# Patient Record
Sex: Female | Born: 1970
Health system: Southern US, Community
[De-identification: ages and names within clinical notes are randomized; demographics above are authoritative.]

## PROBLEM LIST (undated history)

## (undated) DIAGNOSIS — K52839 Microscopic colitis, unspecified: Secondary | ICD-10-CM

## (undated) DIAGNOSIS — K219 Gastro-esophageal reflux disease without esophagitis: Secondary | ICD-10-CM

## (undated) DIAGNOSIS — R87629 Unspecified abnormal cytological findings in specimens from vagina: Secondary | ICD-10-CM

## (undated) DIAGNOSIS — F419 Anxiety disorder, unspecified: Secondary | ICD-10-CM

## (undated) DIAGNOSIS — D649 Anemia, unspecified: Secondary | ICD-10-CM

## (undated) HISTORY — PX: ESOPHAGOGASTRODUODENOSCOPY: SHX1529

## (undated) HISTORY — DX: Microscopic colitis, unspecified: K52.839

## (undated) HISTORY — DX: Anxiety disorder, unspecified: F41.9

## (undated) HISTORY — DX: Unspecified abnormal cytological findings in specimens from vagina: R87.629

## (undated) HISTORY — DX: Gastro-esophageal reflux disease without esophagitis: K21.9

## (undated) HISTORY — PX: COLONOSCOPY: SHX174

---

## 1999-12-27 ENCOUNTER — Other Ambulatory Visit: Admission: RE | Admit: 1999-12-27 | Discharge: 1999-12-27 | Payer: Self-pay | Admitting: Obstetrics

## 2000-07-05 ENCOUNTER — Inpatient Hospital Stay (HOSPITAL_COMMUNITY): Admission: AD | Admit: 2000-07-05 | Discharge: 2000-07-07 | Payer: Self-pay | Admitting: *Deleted

## 2000-07-06 ENCOUNTER — Encounter: Payer: Self-pay | Admitting: *Deleted

## 2004-10-19 ENCOUNTER — Inpatient Hospital Stay (HOSPITAL_COMMUNITY): Admission: AD | Admit: 2004-10-19 | Discharge: 2004-10-20 | Payer: Self-pay | Admitting: Obstetrics & Gynecology

## 2004-10-19 ENCOUNTER — Ambulatory Visit: Payer: Self-pay | Admitting: Obstetrics & Gynecology

## 2004-10-19 ENCOUNTER — Ambulatory Visit: Payer: Self-pay | Admitting: Family Medicine

## 2004-10-22 ENCOUNTER — Inpatient Hospital Stay (HOSPITAL_COMMUNITY): Admission: AD | Admit: 2004-10-22 | Discharge: 2004-10-22 | Payer: Self-pay | Admitting: Obstetrics & Gynecology

## 2004-10-25 ENCOUNTER — Inpatient Hospital Stay (HOSPITAL_COMMUNITY): Admission: AD | Admit: 2004-10-25 | Discharge: 2004-10-25 | Payer: Self-pay | Admitting: Family Medicine

## 2004-11-01 ENCOUNTER — Inpatient Hospital Stay (HOSPITAL_COMMUNITY): Admission: AD | Admit: 2004-11-01 | Discharge: 2004-11-01 | Payer: Self-pay | Admitting: Obstetrics and Gynecology

## 2013-09-14 ENCOUNTER — Telehealth (HOSPITAL_COMMUNITY): Payer: Self-pay | Admitting: *Deleted

## 2013-09-14 NOTE — Telephone Encounter (Signed)
Patient scheduled with [facility]

## 2013-09-21 ENCOUNTER — Encounter (HOSPITAL_COMMUNITY): Payer: Self-pay

## 2013-09-22 ENCOUNTER — Encounter (HOSPITAL_COMMUNITY): Payer: Self-pay

## 2013-09-22 ENCOUNTER — Encounter (INDEPENDENT_AMBULATORY_CARE_PROVIDER_SITE_OTHER): Payer: Self-pay

## 2013-09-22 ENCOUNTER — Ambulatory Visit (HOSPITAL_COMMUNITY)
Admission: RE | Admit: 2013-09-22 | Discharge: 2013-09-22 | Disposition: A | Discharge status: Home / Self Care | Payer: Self-pay | Source: Ambulatory Visit | Attending: Obstetrics and Gynecology | Admitting: Obstetrics and Gynecology

## 2013-09-22 ENCOUNTER — Other Ambulatory Visit: Payer: Self-pay | Admitting: Obstetrics and Gynecology

## 2013-09-22 VITALS — BP 100/60 | Temp 98.1°F | Ht 66.5 in | Wt 164.2 lb

## 2013-09-22 DIAGNOSIS — Z1231 Encounter for screening mammogram for malignant neoplasm of breast: Secondary | ICD-10-CM

## 2013-09-22 DIAGNOSIS — Z1239 Encounter for other screening for malignant neoplasm of breast: Secondary | ICD-10-CM

## 2013-09-22 DIAGNOSIS — R87619 Unspecified abnormal cytological findings in specimens from cervix uteri: Secondary | ICD-10-CM

## 2013-09-22 HISTORY — DX: Anemia, unspecified: D64.9

## 2013-09-22 NOTE — Progress Notes (Signed)
Patient referred to [facility] by the [facility] Department due to recommending a colposcopy and endometrial biopsy for follow up of abnormal AGUS Pap smear on 09/02/2013.  Pap Smear:    Pap smear not completed today. Last Pap smear was 09/02/2013 at the [facility] and AGUS. Referred patient to the [facility] for a colposcopy and endometrial biopsy for follow up. Martie Lee will call patient with follow up appointment time and date. Per patient she has a history of an abnormal Pap smear in January 2014 that required a repeat Pap smear for follow that was April 2014 at [facility] and normal. Last Pap smear result is scanned in EPIC under media.  Physical exam: Breasts Breasts symmetrical. No skin abnormalities bilateral breasts. No nipple retraction bilateral breasts. No nipple discharge bilateral breasts. No lymphadenopathy. No lumps palpated bilateral breasts. No complaints of pain or tenderness on exam. Patient escorted to mammography for a screening mammogram.        Pelvic/Bimanual No Pap smear completed today since last Pap smear was 09/02/2013. Pap smear not indicated per [facility]CP guidelines.

## 2013-09-22 NOTE — Patient Instructions (Addendum)
Taught April Calhoun how to perform BSE and gave educational materials to take home. Patient did not need a Pap smear today due to last Pap smear was 09/02/2013 and abnormal. Referred patient to the [facility] for a colposcopy and endometrial biopsy for follow up. April Calhoun will call patient with follow up appointment time and date. Smoking Cessation discussed with patient.  April Calhoun verbalized understanding. Patient escorted to mammography for a screening mammogram.  April Calhoun, April Maser, RN 4:38 PM

## 2013-10-28 ENCOUNTER — Other Ambulatory Visit (HOSPITAL_COMMUNITY)
Admission: RE | Admit: 2013-10-28 | Discharge: 2013-10-28 | Disposition: A | Discharge status: Home / Self Care | Payer: Self-pay | Source: Ambulatory Visit | Attending: Obstetrics & Gynecology | Admitting: Obstetrics & Gynecology

## 2013-10-28 ENCOUNTER — Encounter: Payer: Self-pay | Admitting: Obstetrics & Gynecology

## 2013-10-28 ENCOUNTER — Ambulatory Visit (INDEPENDENT_AMBULATORY_CARE_PROVIDER_SITE_OTHER): Payer: Self-pay | Admitting: Obstetrics & Gynecology

## 2013-10-28 VITALS — BP 123/71 | HR 72 | Temp 97.9°F | Resp 20 | Ht 66.0 in | Wt 164.8 lb

## 2013-10-28 DIAGNOSIS — R6889 Other general symptoms and signs: Secondary | ICD-10-CM

## 2013-10-28 DIAGNOSIS — IMO0002 Reserved for concepts with insufficient information to code with codable children: Secondary | ICD-10-CM

## 2013-10-28 DIAGNOSIS — Z01812 Encounter for preprocedural laboratory examination: Secondary | ICD-10-CM

## 2013-10-28 DIAGNOSIS — N870 Mild cervical dysplasia: Secondary | ICD-10-CM | POA: Insufficient documentation

## 2013-10-28 LAB — POCT PREGNANCY, URINE
Preg Test, Ur: NEGATIVE
Preg Test, Ur: NEGATIVE

## 2013-10-28 NOTE — Progress Notes (Signed)
Patient ID: April Calhoun, female   DOB: 1970-09-25, 43 y.o.   MRN: [id number]  Chief Complaint  Patient presents with  . Referral    from [facility] for Colpo    HPI April Calhoun is a 43 y.o. female.  [id number] Patient's last menstrual period was 10/23/2013. AGUS pap 08/2014  HPI  Indications: Pap smear on December 2015 showed: glandular cell abnormality (AGUS). Previous colposcopy: . Prior cervical treatment:  .  Past Medical History  Diagnosis Date  . Anemia   . Vaginal Pap smear, abnormal     History reviewed. No pertinent past surgical history.  Family History  Problem Relation Age of Onset  . Diabetes Maternal Grandmother   . Diabetes Paternal Grandmother   . Hyperlipidemia Mother   . Hyperlipidemia Sister     Social History History  Substance Use Topics  . Smoking status: Current Every Day Smoker -- 0.25 packs/day for 9 years    Types: Cigarettes  . Smokeless tobacco: Never Used  . Alcohol Use: No    No Known Allergies  Current Outpatient Prescriptions  Medication Sig Dispense Refill  . cetirizine (ZYRTEC) 10 MG tablet Take 10 mg by mouth daily.      Marland Kitchen. ibuprofen (ADVIL,MOTRIN) 800 MG tablet Take 800 mg by mouth every 8 (eight) hours as needed.       No current facility-administered medications for this visit.    Review of Systems Review of Systems  Blood pressure 123/71, pulse 72, temperature 97.9 F (36.6 C), temperature source Oral, resp. rate 20, height 5\' 6"  (1.676 m), weight 164 lb 12.8 oz (74.753 kg), last menstrual period 10/23/2013.  Physical Exam Physical Exam  Data Reviewed Pap and note [facility]  Assessment    Procedure Details  The risks and benefits of the procedure and Written informed consent obtained. Anterior cervix ectropion, mild AWE TZ seen  Speculum placed in vagina and excellent visualization of cervix achieved, cervix swabbed x 3 with acetic acid solution. Patient given informed consent, signed copy in the chart, time  out was performed. Appropriate time out taken. . The patient was placed in the lithotomy position and the cervix brought into view with sterile speculum.  Portio of cervix cleansed x 2 with betadine swabs.  A tenaculum was placed in the anterior lip of the cervix.  The uterus was sounded for depth of 8 cm . A pipelle was introduced to into the uterus, suction created,  and an endometrial sample was obtained. All equipment was removed and accounted for.  The patient tolerated the procedure well.    Patient given post procedure instructions. The patient will return in 2 weeks for results. Specimens: ECC, 12 and 5 o'clock  Complications: none.     Plan    Specimens labelled and sent to Pathology. Return to discuss Pathology results in 2 weeks.      ARNOLD,JAMES 10/28/2013, 4:43 PM

## 2013-10-28 NOTE — Progress Notes (Signed)
Pt states she has been having abdominal/pelvic/vaginal pain since October. She had ectopic pregnancy 9 yrs ago and has had frequent pain on LLQ for 1 year.  Colposcopy and endometrial biopsy procedures explained. Pt speaks English and declined interpreter.                                         UPT today = negative

## 2013-10-28 NOTE — Patient Instructions (Signed)
Colposcopa (Colposcopy) La colposcopa es un procedimiento para examinar el cuello del tero y la vagina o la zona externa alrededor de la vagina, para buscar signos de enfermedad o anormalidades. Para realizar este procedimiento se utiliza un microscopio con luz, llamado colposcopio. Durante el procedimiento podrn tomarle muestras de tejido, en caso que el profesional encuentre clulas anormales. La colposcopa se indica si la mujer tiene:  Papanicolau anormal. El Papanicolaou es un examen mdico que se realiza para Chief of Staff las clulas que estn en la superficie del cuello uterino.  Un resultado de Pap que podra indicar la presencia del virus del Engineer, technical sales (VPH). El virus puede producir verrugas genitales y est relacionado con el desarrollo de cncer cervical.  Una lcera en el cuello del tero y el resultado del Pap fue normal.  Se han observado verrugas genitales en el cuello del tero o en la zona externa de la vagina.  Una madre que ha consumido dietilstilbestrol (DES) durante el Media planner.  Relaciones sexuales dolorosas.  Hemorragias vaginales, especialmente despus de Retail banker. INFORME A SU MDICO:  Cualquier alergia que tenga.  Todos los [employer] [place], incluyendo vitaminas, hierbas, gotas oftlmicas, cremas y medicamentos de venta libre.  Problemas previos que usted o los [employer] de su familia hayan tenido con el uso de anestsicos.  Enfermedades de [employer].  Cirugas previas.  Padecimientos mdicos. RIESGOS Y COMPLICACIONES En general, la colposcopa es un procedimiento seguro. Sin embargo, Games developer procedimiento, pueden surgir complicaciones. Las complicaciones posibles son:  Hemorragias.  Infeccin.  Lesiones que no se detectan. ANTES DEL PROCEDIMIENTO   Informe al mdico si tiene el perodo menstrual. En general, la colposcopa no se realiza Paediatric nurse.  Durante las 24 horas previas a la colposcopa  no debe:  Patent attorney.  Usar tampones.  Aplicarse medicamentos, cremas o supositorios en la vagina.  Tener [employer]. PROCEDIMIENTO  Durante el procedimiento, estar acostada sobre la espalda con los pies en los soportes (estribos). Le colocarn el la vagina un instrumento metlico o plstico entibiado espculo) para [place] y permitir al profesional visualizar el cuello del tero. El colposcopio se coloca fuera de la vagina. Este instrumento se [place] para ampliar y examinar el cuello del tero, la vagina y la zona externa de la misma. Se aplica una pequea cantidad de solucin lquida en la zona a observar. Este lquido facilita la observacin de clulas anormales. El mdico utilizar instrumentos para aspirar la mucosidad y las clulas del canal del cuello del tero. Luego registrar la ubicacin de las reas anormales. Si le hacen una biopsia durante el procedimiento, le aplicarn un medicamento para adormecer la zona (anestsico local). Podr sentir Clinical cytogeneticist o clicos leves mientras le hacen la biopsia. Despus del procedimiento, las muestras de tejido Publishing copy durante la biopsia se enviarn al laboratorio para ser [employer]. DESPUS DEL PROCEDIMIENTO  Le darn instrucciones para que concurra al control con su mdico para recibir los [employer]. Es importante que cumpla con todas las visitas. Document Released: 09/10/2005 Document Revised: 05/13/2013 [facility] Patient Information 2014 [place], [place].

## 2013-11-02 ENCOUNTER — Encounter: Payer: Self-pay | Admitting: *Deleted

## 2013-11-02 ENCOUNTER — Telehealth: Payer: Self-pay

## 2013-11-02 NOTE — Telephone Encounter (Signed)
Message copied by Louanna RawAMPBELL, Anaisa Radi M on Mon Nov 02, 2013  2:31 PM ------      Message from: Adam PhenixRNOLD, JAMES G      Created: Sat Oct 31, 2013  9:46 AM       Notify patient repeat pap and cotest 12 and 24 months ------

## 2013-11-03 NOTE — Telephone Encounter (Signed)
Called pt. With April Calhoun. No answer. Left message stating we are calling with results, non-urgent, please call clinic.

## 2013-11-04 NOTE — Telephone Encounter (Signed)
Zollie ScaleOlivia called back and left  A message she is calling because she had a message to call

## 2013-11-04 NOTE — Telephone Encounter (Signed)
Called April Calhoun with Interpreter April Calhoun and informed her results back for her colposcopy and was low grade and reccomendation per MD is repeat Pap with hpv testing in one year and again one year after that. Advised to call a few months ahead to make pap appt for Feb 2016.  Also emphasized is important to get pap in one year and not wait longer. April Calhoun voices understanding.

## 2014-07-26 ENCOUNTER — Encounter: Payer: Self-pay | Admitting: Obstetrics & Gynecology

## 2014-11-16 ENCOUNTER — Telehealth (HOSPITAL_COMMUNITY): Payer: Self-pay | Admitting: *Deleted

## 2014-11-16 NOTE — Telephone Encounter (Signed)
Telephoned patient at home # and left message to return call to [facility]. Used interpreter Julie Sowell. 

## 2014-12-17 ENCOUNTER — Telehealth (HOSPITAL_COMMUNITY): Payer: Self-pay | Admitting: *Deleted

## 2014-12-17 NOTE — Telephone Encounter (Signed)
Attempted to call patient with interpreter Nira ConnJulia Sowell to schedule [facility] and Pap smear per recommendation. No one answered phone. Left voicemail for patient to call me back.

## 2015-06-16 ENCOUNTER — Telehealth (HOSPITAL_COMMUNITY): Payer: Self-pay | Admitting: *Deleted

## 2015-06-16 NOTE — Telephone Encounter (Signed)
Telephoned patient at home # and left message to return call to [facility]. Used interpreter Julie Sowell. 

## 2015-07-12 ENCOUNTER — Other Ambulatory Visit: Payer: Self-pay | Admitting: Obstetrics and Gynecology

## 2015-07-12 DIAGNOSIS — Z1231 Encounter for screening mammogram for malignant neoplasm of breast: Secondary | ICD-10-CM

## 2015-07-22 ENCOUNTER — Ambulatory Visit (HOSPITAL_COMMUNITY)
Admission: RE | Admit: 2015-07-22 | Discharge: 2015-07-22 | Disposition: A | Discharge status: Home / Self Care | Payer: Self-pay | Source: Ambulatory Visit | Attending: Obstetrics and Gynecology | Admitting: Obstetrics and Gynecology

## 2015-07-22 ENCOUNTER — Encounter (HOSPITAL_COMMUNITY): Payer: Self-pay

## 2015-07-22 ENCOUNTER — Ambulatory Visit
Admission: RE | Admit: 2015-07-22 | Discharge: 2015-07-22 | Disposition: A | Discharge status: Home / Self Care | Payer: No Typology Code available for payment source | Source: Ambulatory Visit | Attending: Obstetrics and Gynecology | Admitting: Obstetrics and Gynecology

## 2015-07-22 VITALS — BP 108/64 | Temp 98.0°F | Ht 66.0 in | Wt 154.0 lb

## 2015-07-22 DIAGNOSIS — Z1231 Encounter for screening mammogram for malignant neoplasm of breast: Secondary | ICD-10-CM

## 2015-07-22 DIAGNOSIS — Z01419 Encounter for gynecological examination (general) (routine) without abnormal findings: Secondary | ICD-10-CM

## 2015-07-22 NOTE — Patient Instructions (Addendum)
Educational materials on breast self awareness. Explained to April Calhoun that follow-up to today's Pap smear will be based on the result. [facility]. Appointment scheduled for Friday, July 22, 2015 at 1120. Patient aware of appointment and will be there. Let patient know will follow up with her within the next couple weeks with results with results of Pap smear by phone. Smoking Cessation discussed with patient. Referred patient to the [facility] and gave resources to free smoking cessation classes offered at the [facility]. April Calhoun verbalized understanding.  Marylene Masek, Kathaleen Maserhristine Poll, RN 12:26 PM

## 2015-07-22 NOTE — Progress Notes (Signed)
No complaints today.  Pap Smear:  Pap smear completed today. [facility]. [facility] and had a colposcopy and endometrial biopsy on 10/28/2013. 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 Per patient she has a history of an abnormal Pap smear in January 2014 that required a repeat Pap smear for follow that [facility] and normal. Last Pap smear result is scanned in EPIC under media.  Physical exam: Breasts Breasts symmetrical. No skin abnormalities bilateral breasts. No nipple retraction bilateral breasts. No nipple discharge bilateral breasts. No lymphadenopathy. No lumps palpated bilateral breasts. No complaints of pain or tenderness on exam. [facility] mamm[facility]. Appointment scheduled for Friday, July 22, 2015 at 1120.           Pelvic/Bimanual   Ext Genitalia No lesions, no swelling and no discharge observed on external genitalia.         Vagina Vagina pink and normal texture. No lesions or discharge observed in vagina.          Cervix Cervix is present. Cervix pink with a few whitish colored lesions observed on the right side of the cervix next to the os. No discharge observed.     Uterus Uterus is present and palpable. Uterus in normal position and normal size.        Adnexae Bilateral ovaries present and palpable. No tenderness on palpation.          Rectovaginal No rectal exam completed today since patient had no rectal complaints. No skin abnormalities observed on exam.      Smoking Cessation discussed with patient. Referred patient to the [facility] and gave resources to free smoking cessation classes offered at the [facility].  Used interpreter Nira ConnJulia Sowell.

## 2015-07-26 ENCOUNTER — Other Ambulatory Visit: Payer: Self-pay | Admitting: Obstetrics and Gynecology

## 2015-07-26 DIAGNOSIS — R928 Other abnormal and inconclusive findings on diagnostic imaging of breast: Secondary | ICD-10-CM

## 2015-07-28 LAB — CYTOLOGY - PAP

## 2015-08-01 ENCOUNTER — Ambulatory Visit
Admission: RE | Admit: 2015-08-01 | Discharge: 2015-08-01 | Disposition: A | Discharge status: Home / Self Care | Payer: No Typology Code available for payment source | Source: Ambulatory Visit | Attending: Obstetrics and Gynecology | Admitting: Obstetrics and Gynecology

## 2015-08-01 DIAGNOSIS — R928 Other abnormal and inconclusive findings on diagnostic imaging of breast: Secondary | ICD-10-CM

## 2015-08-02 ENCOUNTER — Telehealth (HOSPITAL_COMMUNITY): Payer: Self-pay | Admitting: *Deleted

## 2015-08-02 NOTE — Telephone Encounter (Signed)
Telephoned patient at home # and left message to return call to [facility]. Used interpreter Julie Sowell. 

## 2015-08-02 NOTE — Telephone Encounter (Signed)
Telephoned patient at home # and discussed negative pap smear results. HPV negative. Next pap due in one year. Patient voiced unerstanding.

## 2018-10-25 ENCOUNTER — Emergency Department (HOSPITAL_COMMUNITY): Payer: No Typology Code available for payment source

## 2018-10-25 ENCOUNTER — Emergency Department (HOSPITAL_COMMUNITY)
Admission: EM | Admit: 2018-10-25 | Discharge: 2018-10-25 | Disposition: A | Discharge status: Home / Self Care | Payer: No Typology Code available for payment source | Attending: Emergency Medicine | Admitting: Emergency Medicine

## 2018-10-25 ENCOUNTER — Ambulatory Visit (HOSPITAL_COMMUNITY)
Admission: EM | Admit: 2018-10-25 | Discharge: 2018-10-25 | Disposition: A | Discharge status: Home / Self Care | Payer: No Typology Code available for payment source | Attending: Family Medicine | Admitting: Family Medicine

## 2018-10-25 ENCOUNTER — Encounter (HOSPITAL_COMMUNITY): Payer: Self-pay

## 2018-10-25 ENCOUNTER — Other Ambulatory Visit: Payer: Self-pay

## 2018-10-25 DIAGNOSIS — R079 Chest pain, unspecified: Secondary | ICD-10-CM | POA: Insufficient documentation

## 2018-10-25 DIAGNOSIS — F17210 Nicotine dependence, cigarettes, uncomplicated: Secondary | ICD-10-CM | POA: Insufficient documentation

## 2018-10-25 DIAGNOSIS — R200 Anesthesia of skin: Secondary | ICD-10-CM | POA: Insufficient documentation

## 2018-10-25 LAB — BASIC METABOLIC PANEL
Anion gap: 10 (ref 5–15)
BUN: 14 mg/dL (ref 6–20)
CHLORIDE: 103 mmol/L (ref 98–111)
CO2: 24 mmol/L (ref 22–32)
Calcium: 9.1 mg/dL (ref 8.9–10.3)
Creatinine, Ser: 0.55 mg/dL (ref 0.44–1.00)
GFR calc non Af Amer: >60 mL/min (ref >60)
GLUCOSE: 92 mg/dL (ref 70–99)
POTASSIUM: 4 mmol/L (ref 3.5–5.1)
Sodium: 137 mmol/L (ref 135–145)

## 2018-10-25 LAB — CBC
HEMATOCRIT: 39.2 % (ref 36.0–46.0)
HEMOGLOBIN: 12.6 g/dL (ref 12.0–15.0)
MCH: 28.1 pg (ref 26.0–34.0)
MCHC: 32.1 g/dL (ref 30.0–36.0)
MCV: 87.3 fL (ref 80.0–100.0)
Platelets: 201 10*3/uL (ref 150–400)
RBC: 4.49 MIL/uL (ref 3.87–5.11)
RDW: 14.6 % (ref 11.5–15.5)
WBC: 5.7 10*3/uL (ref 4.0–10.5)
nRBC: 0 % (ref 0.0–0.2)

## 2018-10-25 LAB — I-STAT TROPONIN, ED: Troponin i, poc: 0 ng/mL (ref 0.00–0.08)

## 2018-10-25 MED ORDER — ASPIRIN 81 MG PO CHEW
324.0000 mg | CHEWABLE_TABLET | Freq: Once | ORAL | Status: AC
Start: 2018-10-25 — End: 2018-10-25
  Administered 2018-10-25: 324 mg via ORAL
  Filled 2018-10-25: qty 4

## 2018-10-25 MED ORDER — CYCLOBENZAPRINE HCL 10 MG PO TABS: 5.0000 mg | ORAL_TABLET | Freq: Once | ORAL | Status: DC

## 2018-10-25 MED ORDER — CYCLOBENZAPRINE HCL 10 MG PO TABS: 10.0000 mg | ORAL_TABLET | Freq: Two times a day (BID) | ORAL | 0 refills | Status: DC | PRN

## 2018-10-25 NOTE — ED Triage Notes (Signed)
Pt arrives POV for eval of chest discomfort, neck pain and L arm numbness. Pt reports she awoke w/ L arm numbness this morning at 0830 and reports L sided neck pain onset at that same time. Pt w/ no weakness or changes in sensation to L arm. No focal neuro deficits on exam. GCS 15.

## 2018-10-25 NOTE — ED Provider Notes (Signed)
[facility] EMERGENCY DEPARTMENT Provider Note   CSN: [id number] Arrival date & time: 10/25/18  1400     History   Chief Complaint Chief Complaint  Patient presents with  . Arm Numbness  . Chest Pain    HPI MARJAN FATICA is a 48 y.o. female.  HPI 48 year old female presents today complaining of 2 weeks of waxing and waning chest pain.  She is unable to identify anything that causes the pain to ".  Describes it as pressure in the left chest with some numbness in her arm.  She denies it occurring secondary to exertion, coughing, or associated symptoms such as dyspnea.  She does complain of some muscular pain in the upper back.  She feels that this is occurred secondary to stress.  Today she has had the pain since awakening greater than 5 hours.  It is currently 5 out of 10.  She has taken Naprosyn for pain and needs a pain patch on her back.  She has no history of coronary artery disease.  She is a smoker.  She denies hypertension, hypercholesterolemia, or family history of heart disease. Past Medical History:  Diagnosis Date  . Anemia   . Vaginal Pap smear, abnormal     Patient Active Problem List   Diagnosis Date Noted  . 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 No pertinent surgical history.   OB History    Gravida  4   Para  2   Term  2   Preterm      AB  2   Living  2     SAB  1   TAB      Ectopic  1   Multiple      Live Births               Home Medications    Prior to Admission medications   Medication Sig Start Date End Date Taking? Authorizing Provider  cetirizine (ZYRTEC) 10 MG tablet Take 10 mg by mouth daily.    [provider]  ibuprofen (ADVIL,MOTRIN) 800 MG tablet Take 800 mg by mouth every 8 (eight) hours as needed.    [provider]    Family History Family History  Problem Relation Age of Onset  . Diabetes  Maternal Grandmother   . Diabetes Paternal Grandmother   . Hyperlipidemia Mother   . Hyperlipidemia Sister     Social History Social History   Tobacco Use  . Smoking status: Current Every Day Smoker    Packs/day: 0.25    Years: 9.00    Pack years: 2.25    Types: Cigarettes  . Smokeless tobacco: Never Used  Substance Use Topics  . Alcohol use: No  . Drug use: No     Allergies   Patient has no known allergies.   Review of Systems Review of Systems  All other systems reviewed and are negative.    Physical Exam Updated Vital Signs BP (!) 146/86   Pulse 70   Temp 98.7 F (37.1 C) (Oral)   Resp 18   Ht 1.676 m (5\' 6" )   Wt 70.3 kg   SpO2 100%   BMI 25.02 kg/m   Physical Exam Vitals signs and nursing note reviewed.  Constitutional:      Appearance: She is well-developed.  HENT:     Head: Normocephalic and atraumatic.     Right Ear: External ear normal.  Left Ear: External ear normal.     Nose: Nose normal.  Eyes:     Conjunctiva/sclera: Conjunctivae normal.     Pupils: Pupils are equal, round, and reactive to light.  Neck:     Musculoskeletal: Normal range of motion and neck supple.     Thyroid: No thyromegaly.     Vascular: No JVD.     Trachea: No tracheal deviation.  Cardiovascular:     Rate and Rhythm: Normal rate and regular rhythm.     Heart sounds: Normal heart sounds.  Pulmonary:     Effort: Pulmonary effort is normal.     Breath sounds: Normal breath sounds. No wheezing.  Chest:     Chest wall: No mass, deformity or crepitus.  Abdominal:     General: Bowel sounds are normal.     Palpations: Abdomen is soft. There is no mass.     Tenderness: There is no abdominal tenderness. There is no guarding.  Musculoskeletal: Normal range of motion.  Lymphadenopathy:     Cervical: No cervical adenopathy.  Skin:    General: Skin is warm and dry.  Neurological:     Mental Status: She is alert and oriented to person, place, and time.     GCS: GCS  eye subscore is 4. GCS verbal subscore is 5. GCS motor subscore is 6.     Cranial Nerves: No cranial nerve deficit.     Sensory: No sensory deficit.     Gait: Gait normal.     Deep Tendon Reflexes: Reflexes are normal and symmetric. Babinski sign absent on the right side. Babinski sign absent on the left side.     Reflex Scores:      Bicep reflexes are 2+ on the right side and 2+ on the left side.      Patellar reflexes are 2+ on the right side and 2+ on the left side.    Comments: Strength is normal and equal throughout. Cranial nerves grossly intact. Patient fluent. No gross ataxia and patient able to ambulate without difficulty.  Psychiatric:        Behavior: Behavior normal.        Thought Content: Thought content normal.        Judgment: Judgment normal.      ED Treatments / Results  Labs (all labs ordered are listed, but only abnormal results are displayed) Labs Reviewed  CBC  BASIC METABOLIC PANEL  I-STAT TROPONIN, ED    EKG EKG Interpretation  Date/Time:  Saturday October 25 2018 14:07:15 EST Ventricular Rate:  66 PR Interval:  134 QRS Duration: 80 QT Interval:  410 QTC Calculation: 429 R Axis:   48 Text Interpretation:  Normal sinus rhythm Normal ECG Confirmed by Margarita Grizzleay, Tahlor Berenguer [phone]([id number]) on 10/25/2018 3:32:04 PM   Radiology Dg Chest Port 1 View  Result Date: 10/25/2018 CLINICAL DATA:  Pt arrives POV for eval of chest discomfort, neck pain and L arm numbness. Pt reports she awoke w/ L arm numbness this morning at 0830 and reports L sided neck pain onset at that same time EXAM: PORTABLE CHEST 1 VIEW COMPARISON:  None. FINDINGS: The heart size and mediastinal contours are within normal limits. Both lungs are clear. No pleural effusion or pneumothorax. The visualized skeletal structures are unremarkable. IMPRESSION: No active disease. Electronically Signed   By: Amie Portlandavid  Ormond M.D.   On: 10/25/2018 15:16    Procedures Procedures (including critical care  time)  Medications Ordered in ED Medications  aspirin chewable tablet 324  mg (324 mg Oral Given 10/25/18 1452)     Initial Impression / Assessment and Plan / ED Course  I have reviewed the triage vital signs and the nursing notes.  Pertinent labs & imaging results that were available during my care of the patient were reviewed by me and considered in my medical decision making (see chart for details).    48 year old female presents today with chest pain.  Low index of suspicion for aortic dissection, acute intrapulmonary problem, or PE.  Heart score is 3.  She has had pain present for greater than 6 hours at this point.  Her troponin is normal and do not think she requires a repeat troponin.  Discussed return precautions and need for follow-up with patient she voices understanding.  Final Clinical Impressions(s) / ED Diagnoses   Final diagnoses:  Nonspecific chest pain    ED Discharge Orders    None       Margarita Grizzleay, Kwanza Cancelliere, MD 10/25/18 1539

## 2018-10-25 NOTE — ED Notes (Signed)
Pt here for CP, L arm numbess x7 days, per dr. Hyacinth Meeker pt needs eval in ER, ekg obtained and given to Dr. Hyacinth Meeker. Pt left for ER.

## 2018-10-25 NOTE — ED Notes (Signed)
Declined W/C at D/C and was escorted to lobby by RN. 

## 2019-11-19 ENCOUNTER — Ambulatory Visit (HOSPITAL_BASED_OUTPATIENT_CLINIC_OR_DEPARTMENT_OTHER): Payer: Self-pay | Admitting: Pharmacist

## 2019-11-19 ENCOUNTER — Encounter: Payer: Self-pay | Admitting: Internal Medicine

## 2019-11-19 ENCOUNTER — Other Ambulatory Visit: Payer: Self-pay

## 2019-11-19 ENCOUNTER — Ambulatory Visit: Payer: Self-pay | Attending: Internal Medicine | Admitting: Internal Medicine

## 2019-11-19 VITALS — BP 112/68 | HR 65 | Temp 97.2°F | Resp 16 | Ht 67.0 in | Wt 159.6 lb

## 2019-11-19 DIAGNOSIS — Z23 Encounter for immunization: Secondary | ICD-10-CM

## 2019-11-19 DIAGNOSIS — M542 Cervicalgia: Secondary | ICD-10-CM

## 2019-11-19 DIAGNOSIS — D509 Iron deficiency anemia, unspecified: Secondary | ICD-10-CM

## 2019-11-19 DIAGNOSIS — Z7689 Persons encountering health services in other specified circumstances: Secondary | ICD-10-CM

## 2019-11-19 DIAGNOSIS — R87619 Unspecified abnormal cytological findings in specimens from cervix uteri: Secondary | ICD-10-CM

## 2019-11-19 NOTE — Progress Notes (Signed)
New Patient Office Visit  Subjective:  Patient ID: April Calhoun, female    DOB: 02-11-1971  Age: 49 y.o. MRN: [id number]  CC:  Establish Care  Chief Complaint  Patient presents with  . New Patient (Initial Visit)    HPI April Calhoun is a 49 year old female with history of atypical glandular cells of undetermined significance on cervical Pap smear who presents to establish care.   PRESENT ILLNESS: Taking cyclonezaprine since February 2020 as needed for bilateral shoulder and neck discomfort but doesn't take often because she does not like how the medication makes her feel dizzy. Denies allergies. Denies any present illnesses.  PAST HISTORY: Denies childhood and adulthood illnesses. More than 5 years ago since last visit with a primary care provider. Denies surgical and psychiatric history. Reports has not had influenza vaccine since 20 years ago, would not like one today. Reports up to date on all other immunizations. Has two children. No menstruation since December 2020. Last PAP around 2015, last mammogram around 2016.   Past Medical History:  Diagnosis Date  . Anemia   . Vaginal Pap smear, abnormal    No past surgical history on file.  FAMILY HISTORY: Denies family history of hypertension, coronary artery disease, stroke, thyroid or renal disease, cancer and specify type, arthritis, tuberculosis, asthma, lung disease, headache, seizure disorder, mental illness, suicide, alcohol or drug addiction, and allergies.   Family History  Problem Relation Age of Onset  . Diabetes Maternal Grandmother   . Diabetes Paternal Grandmother   . Hyperlipidemia Mother   . Hyperlipidemia Sister    PERSONAL AND SOCIAL HISTORY: Occupation is Advertising copywriter. Lives with boyfriend and son. Denies exercise. Denies any leisure activities. Small amount of red wine occasionally. Smokes at least 3 cigarettes daily for the past 5 years. Has began and quit smoking multiple times over  the past 9 years. Denies illicit drug use. Social History   Socioeconomic History  . Marital status: Significant Other    Spouse name: Not on file  . Number of children: Not on file  . Years of education: Not on file  . Highest education level: Not on file  Occupational History  . Not on file  Tobacco Use  . Smoking status: Current Every Day Smoker    Packs/day: 0.25    Years: 9.00    Pack years: 2.25    Types: Cigarettes  . Smokeless tobacco: Never Used  Substance and Sexual Activity  . Alcohol use: No  . Drug use: No  . Sexual activity: Yes    Birth control/protection: Condom  Other Topics Concern  . Not on file  Social History Narrative  . Not on file   Social Determinants of Health   Financial Resource Strain:   . Difficulty of Paying Living Expenses: Not on file  Food Insecurity:   . Worried About Programme researcher, broadcasting/film/video in the Last Year: Not on file  . Ran Out of Food in the Last Year: Not on file  Transportation Needs:   . Lack of Transportation (Medical): Not on file  . Lack of Transportation (Non-Medical): Not on file  Physical Activity:   . Days of Exercise per Week: Not on file  . Minutes of Exercise per Session: Not on file  Stress:   . Feeling of Stress : Not on file  Social Connections:   . Frequency of Communication with Friends and Family: Not on file  . Frequency of Social Gatherings with Friends and Family:  Not on file  . Attends Religious Services: Not on file  . Active Member of Clubs or Organizations: Not on file  . Attends Archivist Meetings: Not on file  . Marital Status: Not on file  Intimate Partner Violence:   . Fear of Current or Ex-Partner: Not on file  . Emotionally Abused: Not on file  . Physically Abused: Not on file  . Sexually Abused: Not on file    ROS Review of Systems  Constitutional: Negative for chills and fever.  HENT: Negative for congestion, sore throat and tinnitus.   Eyes: Negative for blurred vision.    Respiratory: Negative for cough, shortness of breath and wheezing.   Cardiovascular: Negative for chest pain, palpitations and leg swelling.  Gastrointestinal: Negative for heartburn, nausea and vomiting.  Musculoskeletal: Positive for neck pain.       Bilateral shoulder pain  All others negative except stated above.  Objective:   Today's Vitals: BP 112/68   Pulse 65   Temp (!) 97.2 F (36.2 C)   Resp 16   Ht 5\' 7"  (1.702 m)   Wt 159 lb 9.6 oz (72.4 kg)   SpO2 98%   BMI 25.00 kg/m   Physical Exam  General appearance - alert, well appearing, and in no distress and oriented to person, place, and time Mental status - alert, oriented to person, place, and time, normal mood, behavior, speech, dress, motor activity, and thought processes Chest - clear to auscultation, no wheezes, rales or rhonchi, symmetric air entry, no tachypnea, retractions or cyanosis Heart - normal rate, regular rhythm, normal S1, S2, no murmurs, rubs, clicks or gallops Neurological - alert, oriented, normal speech, no focal findings or movement disorder noted  Assessment & Plan:  1. Encounter to establish care: - CBC - Comprehensive metabolic panel - Lipid panel - Hemoglobin A1c - HIV antibody (with reflex) -Return in 1 month for physical exam and Pap Smear -Apply for orange card and blue card for [facility] financial assistance -Referral for mammogram needed. Patient should complete mammogram scholarship for financial assistance.  Problem List Items Addressed This Visit    None      Outpatient Encounter Medications as of 11/19/2019  Medication Sig  . cetirizine (ZYRTEC) 10 MG tablet Take 10 mg by mouth daily.  . cyclobenzaprine (FLEXERIL) 10 MG tablet Take 1 tablet (10 mg total) by mouth 2 (two) times daily as needed for muscle spasms. (Patient not taking: Reported on 11/19/2019)  . ibuprofen (ADVIL,MOTRIN) 800 MG tablet Take 800 mg by mouth every 8 (eight) hours as needed.   No  facility-administered encounter medications on file as of 11/19/2019.    Follow-up: 1 month with attending physician     [name], NP

## 2019-11-19 NOTE — Progress Notes (Signed)
Patient presents for vaccination against influenza and tetanus per orders of Dr. Johnson. Consent given. Counseling provided. No contraindications exists. Vaccine administered without incident.   

## 2019-11-19 NOTE — Progress Notes (Signed)
Pt states she has bene having a lot of joint pain   Pt states her joints hurt worse in the morning when she is getting up

## 2019-11-19 NOTE — Patient Instructions (Addendum)
Return in 1 month for PAP.  Vacuna Td (ttanos y difteria): lo que debe saber Td (Tetanus, Diphtheria) Vaccine: What You Need to Know 1. Por qu vacunarse? La vacuna Td puede prevenir el ttanos y la difteria. El ttanos ingresa al organismo a travs de cortes o heridas. La difteria se contagia de persona a Nurse, learning disability.  El Myton (T) provoca rigidez dolorosa en los msculos. El ttanos puede causar graves problemas de [place], como no poder abrir la boca, Best boy dificultad para tragar y Ambulance person, o la muerte.  La DIFTERIA (D) puede causar dificultad para respirar, insuficiencia cardaca, parlisis o muerte. 2. Edward Jolly Td La vacuna Td es solo para nios de 7 aos en adelante, adolescentes y [place].  La vacuna Td habitualmente se aplica como dosis de refuerzo cada 10aos, pero tambin puede administrarse antes si la persona sufre una [place] o herida sucia y grave. En lugar de la vacuna Td, se puede utilizar otra vacuna llamada Tdap que, adems del ttanos y la difteria, protege contra la tos [place], tambin conocida como "tos convulsa".  La vacuna Td puede aplicarse al mismo tiempo que otras vacunas. 3. Hable con el mdico Comunquese con la persona que le coloca las vacunas si la persona que la recibe:  Ha tenido una reaccin alrgica despus de Ardelia Mems dosis anterior de cualquier vacuna contra el ttanos o la difteria, o cualquier alergia grave, potencialmente mortal.  Alguna vez tuvo sndrome de Guillain-Barr (tambin llamado SGB).  Ha tenido dolor intenso o hinchazn despus de una dosis anterior de cualquier vacuna contra el ttanos o la difteria. En algunos casos, es posible que el mdico decida posponer la aplicacin de la vacuna Td para una visita en el futuro.  Las personas que sufren trastornos menores, como un resfro, pueden vacunarse. Las [employer] tienen enfermedades moderadas o graves generalmente deben esperar hasta recuperarse para poder recibir la vacunaTd.  Su mdico puede  darle ms informacin. 4. [place] de [place] reaccin a la vacuna  Despus de recibir la vacuna Td a veces se puede Patent attorney, enrojecimiento o Estate agent donde se aplic la inyeccin, fiebre leve, dolor de cabeza, sensacin de cansancio y nuseas, vmitos, diarrea o dolor de [place]. Las personas a veces se desmayan despus de procedimientos mdicos, incluida la vacunacin. Informe al mdico si se siente mareado, tiene cambios en la visin o zumbidos en los odos.  Al igual que con cualquier [employer], existe una probabilidad muy remota de que una vacuna cause una reaccin alrgica grave, otra lesin grave o la muerte. 5. Qu pasa si se presenta un problema grave? Podra producirse una reaccin alrgica despus de que la persona vacunada abandone la clnica. Si observa signos de Nurse, mental health grave (ronchas, hinchazn de la cara y la garganta, dificultad para respirar, latidos cardacos acelerados, mareos o debilidad), llame al 9-1-1 y lleve a la persona al hospital ms cercano.  Si se presentan otros signos que le preocupan, comunquese con su mdico.  Las reacciones adversas deben informarse al Sistema de Informe de Eventos Adversos de Clinical biochemist (Vaccine Adverse Event Reporting System, VAERS). Por lo general, el mdico presenta este informe o puede hacerlo usted mismo. Visite el sitio web del VAERS en www.[email or web address].SamedayNews.es o llame al 4401793660. El VAERS es solo para Electrical engineer; su personal no proporciona asesoramiento mdico. 6. [place] de Compensacin de Daos por [place] de Compensacin de Daos por Clinical biochemist (National Vaccine Injury Fiserv, VICP) es un programa federal que fue creado para  compensar a las [employer] puedan haber sufrido daos al recibir ciertas vacunas. Visite el sitio web del VICP en SpiritualWord.at o llame al 1-(435) 135-4088 para obtener ms informacin acerca del programa y de cmo presentar  un reclamo. Hay un lmite de tiempo para presentar un reclamo de compensacin. 7. Cmo puedo obtener ms informacin?  Pregntele a su mdico.  Comunquese con el servicio de salud de su localidad o su estado.  Comunquese con Building control surveyor for [employer] and Prevention, CDC (Centros para el Control y la Prevencin de [place]): ? Llame al [phone] (1-800-CDC-INFO) o ? Visite el sitio Environmental manager en PicCapture.uy Declaracin de informacin sobre la vacuna Td (09/27/2018) Esta informacin no tiene Theme park manager el consejo del mdico. Asegrese de hacerle al mdico cualquier pregunta que tenga. Document Revised: 01/20/2019 Document Reviewed: 01/20/2019 Elsevier Patient Education  2020 Elsevier Inc.   Influenza Virus Vaccine injection (Fluarix) Qu es este medicamento? La VACUNA ANTIGRIPAL ayuda a disminuir el riesgo de contraer la influenza, tambin conocida como la gripe. La vacuna solo ayuda a protegerle contra algunas cepas de influenza. Esta vacuna no ayuda a reducir Nurse, adult de contraer influenza pandmica H1N1. Este medicamento puede ser utilizado para otros usos; si tiene alguna pregunta consulte con su proveedor de atencin mdica o con su farmacutico. MARCAS COMUNES: Fluarix, Fluzone Qu le debo informar a mi profesional de la salud antes de tomar este medicamento? Necesita saber si usted presenta alguno de los siguientes problemas o situaciones:  trastorno de sangrado como hemofilia  fiebre o infeccin  sndrome de Guillain-Barre u otros problemas neurolgicos  problemas del sistema inmunolgico  infeccin por el virus de la inmunodeficiencia humana (VIH) o SIDA  niveles bajos de plaquetas en la sangre  esclerosis mltiple  una reaccin Counselling psychologist o inusual a las vacunas antigripales, a los huevos, protenas de pollo, al ltex, a la gentamicina, a otros medicamentos, alimentos, colorantes o conservantes  si est embarazada o buscando  quedar embarazada  si est amamantando a un beb Cmo debo [employer]? Esta vacuna se administra mediante inyeccin por va intramuscular. Lo administra un profesional de [employer]. Recibir una copia de informacin escrita sobre la vacuna antes de cada vacuna. Asegrese de leer este folleto cada vez cuidadosamente. Este folleto puede cambiar con frecuencia. Hable con su pediatra para informarse acerca del uso de este medicamento en nios. Puede requerir atencin especial. Sobredosis: Pngase en contacto inmediatamente con un centro toxicolgico o una sala de urgencia si usted cree que haya tomado demasiado medicamento. ATENCIN: [employer] es solo para usted. No comparta este medicamento con nadie. Qu sucede si me olvido de una dosis? No se aplica en este caso. Qu puede interactuar con este medicamento?  quimioterapia o radioterapia  medicamentos que suprimen el sistema inmunolgico, tales como etanercept, anakinra, infliximab y adalimumab  medicamentos que tratan o previenen cogulos sanguneos, como warfarina  fenitona  medicamentos esteroideos, como la prednisona o la cortisona  teofilina  vacunas Puede ser que esta lista no menciona todas las posibles interacciones. Informe a su profesional de [employer] de [employer] productos a base de hierbas, medicamentos de [place] o suplementos nutritivos que est tomando. Si usted fuma, consume bebidas alcohlicas o si utiliza drogas ilegales, indqueselo tambin a su profesional de [employer]. Algunas sustancias pueden interactuar con su medicamento. A qu debo estar atento al usar [employer]? Informe a su mdico o a Producer, television/film/video de [employer] todos los efectos secundarios que [place]  despus de 3 das. Llame a su proveedor de atencin mdica si se presentan sntomas inusuales dentro de las 6 semanas posteriores a la vacunacin. Es posible que todava pueda contraer la gripe, pero la enfermedad no ser  tan fuerte como normalmente. No puede contraer la gripe de esta vacuna. La vacuna antigripal no le protege contra resfros u otras enfermedades que pueden causar [place]. Debe vacunarse cada ao. Qu efectos secundarios puedo tener al [employer] este medicamento? Efectos secundarios que debe informar a su mdico o a Producer, television/film/video de la salud tan pronto como sea posible:  Therapist, art como erupcin cutnea, picazn o urticarias, hinchazn de la cara, labios o lengua Efectos secundarios que, por lo general, no requieren atencin mdica (debe informarlos a su mdico o a su profesional de la salud si persisten o si son molestos):  fiebre  dolor de cabeza  molestias y Art gallery manager, sensibilidad, enrojecimiento o Paramedic de la inyeccin  cansancio o debilidad Puede ser que esta lista no menciona todos los posibles efectos secundarios. Comunquese a su mdico por asesoramiento mdico [employer]. Usted puede informar los efectos secundarios a la FDA por telfono al 1-800-FDA-1088. Dnde debo guardar mi medicina? Esta vacuna se administra solamente en clnicas, farmacias, consultorio mdico u otro consultorio de un profesional de la salud y no Teacher, early years/pre en su domicilio. ATENCIN: Este folleto es un resumen. Puede ser que no cubra toda la posible informacin. Si usted tiene preguntas acerca de esta medicina, consulte con su mdico, su farmacutico o su profesional de Radiographer, therapeutic.  2020 Elsevier/Gold Standard (2010-03-14 15:31:40)

## 2019-11-20 LAB — COMPREHENSIVE METABOLIC PANEL
ALT: 23 IU/L (ref 0–32)
AST: 22 IU/L (ref 0–40)
Albumin/Globulin Ratio: 1.7 (ref 1.2–2.2)
Albumin: 4.1 g/dL (ref 3.8–4.8)
Alkaline Phosphatase: 69 IU/L (ref 39–117)
BUN/Creatinine Ratio: 25 — ABNORMAL HIGH (ref 9–23)
BUN: 16 mg/dL (ref 6–24)
Bilirubin Total: 0.5 mg/dL (ref 0.0–1.2)
CO2: 23 mmol/L (ref 20–29)
Calcium: 9.1 mg/dL (ref 8.7–10.2)
Chloride: 106 mmol/L (ref 96–106)
Creatinine, Ser: 0.63 mg/dL (ref 0.57–1.00)
GFR calc Af Amer: 122 mL/min/{1.73_m2} (ref >59)
GFR calc non Af Amer: 106 mL/min/{1.73_m2} (ref >59)
Globulin, Total: 2.4 g/dL (ref 1.5–4.5)
Glucose: 88 mg/dL (ref 65–99)
Potassium: 4.2 mmol/L (ref 3.5–5.2)
Sodium: 143 mmol/L (ref 134–144)
Total Protein: 6.5 g/dL (ref 6.0–8.5)

## 2019-11-20 LAB — LIPID PANEL
Chol/HDL Ratio: 3.1 ratio (ref 0.0–4.4)
Cholesterol, Total: 156 mg/dL (ref 100–199)
HDL: 50 mg/dL (ref >39)
LDL Chol Calc (NIH): 88 mg/dL (ref 0–99)
Triglycerides: 100 mg/dL (ref 0–149)
VLDL Cholesterol Cal: 18 mg/dL (ref 5–40)

## 2019-11-20 LAB — CBC
Hematocrit: 32.5 % — ABNORMAL LOW (ref 34.0–46.6)
Hemoglobin: 9.9 g/dL — ABNORMAL LOW (ref 11.1–15.9)
MCH: 23.9 pg — ABNORMAL LOW (ref 26.6–33.0)
MCHC: 30.5 g/dL — ABNORMAL LOW (ref 31.5–35.7)
MCV: 79 fL (ref 79–97)
Platelets: 202 10*3/uL (ref 150–450)
RBC: 4.14 x10E6/uL (ref 3.77–5.28)
RDW: 16 % — ABNORMAL HIGH (ref 11.7–15.4)
WBC: 4.5 10*3/uL (ref 3.4–10.8)

## 2019-11-20 LAB — HEMOGLOBIN A1C
Est. average glucose Bld gHb Est-mCnc: 114 mg/dL
Hgb A1c MFr Bld: 5.6 % (ref 4.8–5.6)

## 2019-11-20 LAB — HIV ANTIBODY (ROUTINE TESTING W REFLEX): HIV Screen 4th Generation wRfx: NONREACTIVE

## 2019-11-20 NOTE — Addendum Note (Signed)
Addended by: Rema Fendt on: 11/20/2019 07:35 AM   Modules accepted: Orders

## 2019-11-20 NOTE — Progress Notes (Signed)
CBC indicative of anemia. Will add on iron panel for confirmation. BUN/creatinine ratio slightly elevated, CMP otherwise normal. Lipid panel normal.

## 2019-11-23 MED ORDER — FERROUS SULFATE 325 (65 FE) MG PO TABS: 325.0000 mg | ORAL_TABLET | Freq: Every day | ORAL | 0 refills | Status: DC

## 2019-11-23 NOTE — Addendum Note (Signed)
Addended by: Rema Fendt on: 11/23/2019 11:16 AM   Modules accepted: Orders

## 2019-11-23 NOTE — Progress Notes (Signed)
Please call patient with update. Patient has iron deficiency anemia meaning her iron is low. Please take ferrous sulfate 325 mg/day for at least 6 months. Will reassess labs at next visit if needed. Eating iron rich foods will assist with this as well such as liver, dried beans and peas, prunes, raisins, and green leafy vegetables. Taking iron pill with orange juice which is rich in vitamin C may also increase absorption.

## 2019-11-27 LAB — IRON,TIBC AND FERRITIN PANEL
Ferritin: 3 ng/mL — ABNORMAL LOW (ref 15–150)
Iron Saturation: 8 % — CL (ref 15–55)
Iron: 38 ug/dL (ref 27–159)
Total Iron Binding Capacity: 484 ug/dL — ABNORMAL HIGH (ref 250–450)
UIBC: 446 ug/dL — ABNORMAL HIGH (ref 131–425)

## 2019-11-27 LAB — SPECIMEN STATUS REPORT

## 2019-11-30 ENCOUNTER — Telehealth: Payer: Self-pay

## 2019-11-30 NOTE — Telephone Encounter (Signed)
Contacted pt to go over lab results pt didn't answer lvm asking pt to give me a call at her earliest convenience  

## 2019-12-01 ENCOUNTER — Telehealth: Payer: Self-pay

## 2019-12-01 NOTE — Telephone Encounter (Signed)
Pt returned call to go over lab results pt is aware and doesn't have any questions or concerns  

## 2020-01-07 ENCOUNTER — Encounter: Payer: No Typology Code available for payment source | Admitting: Internal Medicine

## 2020-01-26 ENCOUNTER — Other Ambulatory Visit: Payer: Self-pay

## 2020-01-26 ENCOUNTER — Ambulatory Visit: Payer: Self-pay

## 2020-03-10 ENCOUNTER — Ambulatory Visit: Payer: Self-pay | Attending: Internal Medicine | Admitting: Internal Medicine

## 2020-03-10 ENCOUNTER — Other Ambulatory Visit: Payer: Self-pay | Admitting: Family

## 2020-03-10 ENCOUNTER — Other Ambulatory Visit: Payer: Self-pay

## 2020-03-10 ENCOUNTER — Encounter: Payer: Self-pay | Admitting: Internal Medicine

## 2020-03-10 VITALS — BP 112/73 | HR 63 | Temp 97.5°F | Resp 16 | Wt 156.0 lb

## 2020-03-10 DIAGNOSIS — D509 Iron deficiency anemia, unspecified: Secondary | ICD-10-CM

## 2020-03-10 DIAGNOSIS — R197 Diarrhea, unspecified: Secondary | ICD-10-CM

## 2020-03-10 DIAGNOSIS — Z124 Encounter for screening for malignant neoplasm of cervix: Secondary | ICD-10-CM

## 2020-03-10 DIAGNOSIS — Z1211 Encounter for screening for malignant neoplasm of colon: Secondary | ICD-10-CM

## 2020-03-10 DIAGNOSIS — Z1331 Encounter for screening for depression: Secondary | ICD-10-CM

## 2020-03-10 DIAGNOSIS — Z1231 Encounter for screening mammogram for malignant neoplasm of breast: Secondary | ICD-10-CM

## 2020-03-10 NOTE — Progress Notes (Signed)
Patient ID: April Calhoun, female    DOB: 1971/05/11  MRN: [id number]  CC: Gynecologic Exam   Subjective: April Calhoun is a 49 y.o. female who presents for pap/well woman Her concerns today include:  Iron deficiency anemia diagnosed 10/2019,  Pt is G4P2 (2 miscarriage) Last pap was 2018 and was nl per pt. Had abn pap in 2015.  Subseq bx neg per pt LNMP 01/07/2020.  Prior to that it was 08/2019.  Menses last 1 wk. + hot flashes but not too bad. Sexually active with 1 partner.  Uses condoms Chronic intermittent white dischg. No breast lumps.  No previous bx.  Over due for MMG Ovarian cancer in paternal aunt  Found to have iron def anemia 10/2019.  taking iron 1 once a day. she was having diarrhea for 1 mth.  Started after she ate some baby back ribs from an outside source.  The ribs appear to be well cooked.  Diarrhea x 1 mth with BM 3-6 x a day.  Stools are loose.  Noticed blood in stools x 2. Last time was yesterday.  + bloating when she eats green leaf veggies and beans. No fever. No recent international travel.  No major wgh changes.  She has tried Imodium, Pepto-Bismol and probiotics a few times.  None of them seem to have helped.    Patient Active Problem List   Diagnosis Date Noted  . Atypical glandular cells of undetermined significance (AGUS) on cervical Pap smear 09/22/2013     Current Outpatient Medications on File Prior to Visit  Medication Sig Dispense Refill  . cetirizine (ZYRTEC) 10 MG tablet Take 10 mg by mouth daily.    . cyclobenzaprine (FLEXERIL) 10 MG tablet Take 1 tablet (10 mg total) by mouth 2 (two) times daily as needed for muscle spasms. (Patient not taking: Reported on 11/19/2019) 20 tablet 0  . ferrous sulfate 325 (65 FE) MG tablet Take 1 tablet (325 mg total) by mouth daily with breakfast. 100 tablet 0  . ibuprofen (ADVIL,MOTRIN) 800 MG tablet Take 800 mg by mouth every 8 (eight) hours as needed.     No current facility-administered  medications on file prior to visit.    No Known Allergies  Social History   Socioeconomic History  . Marital status: Significant Other    Spouse name: Not on file  . Number of children: Not on file  . Years of education: Not on file  . Highest education level: Not on file  Occupational History  . Not on file  Tobacco Use  . Smoking status: Current Every Day Smoker    Packs/day: 0.25    Years: 9.00    Pack years: 2.25    Types: Cigarettes  . Smokeless tobacco: Never Used  Substance and Sexual Activity  . Alcohol use: No  . Drug use: No  . Sexual activity: Yes    Birth control/protection: Condom  Other Topics Concern  . Not on file  Social History Narrative  . Not on file   Social Determinants of Health   Financial Resource Strain:   . Difficulty of Paying Living Expenses:   Food Insecurity:   . Worried About Programme researcher, broadcasting/film/video in the Last Year:   . Barista in the Last Year:   Transportation Needs:   . Freight forwarder (Medical):   Marland Kitchen Lack of Transportation (Non-Medical):   Physical Activity:   . Days of Exercise per Week:   .  Minutes of Exercise per Session:   Stress:   . Feeling of Stress :   Social Connections:   . Frequency of Communication with Friends and Family:   . Frequency of Social Gatherings with Friends and Family:   . Attends Religious Services:   . Active Member of Clubs or Organizations:   . Attends Archivist Meetings:   Marland Kitchen Marital Status:   Intimate Partner Violence:   . Fear of Current or Ex-Partner:   . Emotionally Abused:   Marland Kitchen Physically Abused:   . Sexually Abused:     Family History  Problem Relation Age of Onset  . Diabetes Maternal Grandmother   . Diabetes Paternal Grandmother   . Hyperlipidemia Mother   . Hyperlipidemia Sister     No past surgical history on file.  ROS: Review of Systems Negative except as stated above  PHYSICAL EXAM: BP 112/73   Pulse 63   Temp (!) 97.5 F (36.4 C)   Resp 16    Wt 156 lb (70.8 kg)   SpO2 98%   BMI 24.43 kg/m   Wt Readings from Last 3 Encounters:  03/10/20 156 lb (70.8 kg)  11/19/19 159 lb 9.6 oz (72.4 kg)  10/25/18 155 lb (70.3 kg)    Physical Exam RN April Calhoun present for pelvic and rectal exam. General appearance - alert, well appearing, and in no distress Breasts - breasts appear normal, no suspicious masses, no skin or nipple changes or axillary nodes Pelvic - normal external genitalia, vulva, vagina, cervix, uterus and adnexa Abdomen: Normal bowel sounds, soft nontender no organomegaly. Rectal exam: No external hemorrhoids.  No solid stool felt within the rectal vault.  Hemoccult positive. Extremities -trace lower extremity edema.  Positive varicose veins in both lower extremities.  Depression screen April Calhoun 2/9 03/10/2020  Decreased Interest 2  Down, Depressed, Hopeless 2  PHQ - 2 Score 4  Altered sleeping 2  Tired, decreased energy 3  Change in appetite 1  Trouble concentrating 1  Moving slowly or fidgety/restless 0  Suicidal thoughts 0  PHQ-9 Score 11    CMP Latest Ref Rng & Units 11/19/2019 10/25/2018  Glucose 65 - 99 mg/dL 88 92  BUN 6 - 24 mg/dL 16 14  Creatinine 0.57 - 1.00 mg/dL 0.63 0.55  Sodium 134 - 144 mmol/L 143 137  Potassium 3.5 - 5.2 mmol/L 4.2 4.0  Chloride 96 - 106 mmol/L 106 103  CO2 20 - 29 mmol/L 23 24  Calcium 8.7 - 10.2 mg/dL 9.1 9.1  Total Protein 6.0 - 8.5 g/dL 6.5 -  Total Bilirubin 0.0 - 1.2 mg/dL 0.5 -  Alkaline Phos 39 - 117 IU/L 69 -  AST 0 - 40 IU/L 22 -  ALT 0 - 32 IU/L 23 -   Lipid Panel     Component Value Date/Time   CHOL 156 11/19/2019 1149   TRIG 100 11/19/2019 1149   HDL 50 11/19/2019 1149   CHOLHDL 3.1 11/19/2019 1149   LDLCALC 88 11/19/2019 1149    CBC    Component Value Date/Time   WBC 4.5 11/19/2019 1149   WBC 5.7 10/25/2018 1451   RBC 4.14 11/19/2019 1149   RBC 4.49 10/25/2018 1451   HGB 9.9 (L) 11/19/2019 1149   HCT 32.5 (L) 11/19/2019 1149   PLT 202  11/19/2019 1149   MCV 79 11/19/2019 1149   MCH 23.9 (L) 11/19/2019 1149   MCH 28.1 10/25/2018 1451   MCHC 30.5 (L) 11/19/2019 1149  MCHC 32.1 10/25/2018 1451   RDW 16.0 (H) 11/19/2019 1149    ASSESSMENT AND PLAN:  1. Pap smear for cervical cancer screening - Cervicovaginal ancillary only - Cytology - PAP  2. Encounter for screening mammogram for malignant neoplasm of breast - MM Digital Screening; Future  3. Screening for colon cancer - Ambulatory referral to Gastroenterology  4. Diarrhea of presumed infectious origin Started after she ate some ribs from a [employer] about a month ago.  She reports noticing blood in the stools x2 and she is heme positive today.  Weight has remained stable.  We will check for C. difficile and routine stool cultures and refer to gastroenterology - Ambulatory referral to Gastroenterology - Cdiff NAA+O+P+Stool Culture  5. Iron deficiency anemia, unspecified iron deficiency anemia type Continue iron supplement.  Recheck CBC today - Ambulatory referral to Gastroenterology - CBC  6. Positive depression screening We did not get to discuss this today.  However I can have our LCSW follow-up with her   Patient was given the opportunity to ask questions.  Patient verbalized understanding of the plan and was able to repeat key elements of the plan.   Orders Placed This Encounter  Procedures  . Cdiff NAA+O+P+Stool Culture  . MM Digital Screening  . CBC  . Ambulatory referral to Gastroenterology     Requested Prescriptions    No prescriptions requested or ordered in this encounter    Return in about 3 months (around 06/10/2020).  April Blue, MD, FACP

## 2020-03-10 NOTE — Patient Instructions (Addendum)
You have been referred to a gastroenterologist.  You have been referred for mammogram.  Please call the BCCCP (breast and cervical cancer control program) at [phone] to schedule diagnostic mammogram    Opciones de alimentos para ayudar a Technical sales engineer [employer] Choices to Help Relieve Diarrhea, Adult Cuando tiene [place], los alimentos que ingiere y los hbitos de alimentacin son Engineer, production. Elegir los [employer] y las bebidas adecuados puede ayudar a lo siguiente:  Actuary.  Remplazar los lquidos y nutrientes perdidos.  Evitar la deshidratacin. Qu pautas generales debo seguir?  Alivio de la diarrea  Elija alimentos con menos de 2g o 0,07oz de fibra por porcin.  Limite las grasas a menos de 8cucharaditas (38g o 1,34oz) por da.  Evite consumir lo siguiente: ? Alimentos y bebidas endulzados con jarabe de maz de alto contenido de fructosa, miel o alcoholes de International aid/development worker, como xilitol, sorbitol y manitol. ? Alimentos que contengan mucha grasa o azcar. ? Comidas fritas, grasosas o picantes. ? Granos, panes y cereales con alto contenido de fibras. ? Nils Pyle y verduras crudas.  Consuma alimentos con alto contenido de probiticos. Entre ellos, los productos lcteos, como el yogur y los productos fermentados de la Tolstoy. Estos ayudan a aumentar la cantidad de bacterias del estmago y de los intestinos (tubo digestivo o gastrointestinal [GI]).  Si tiene intolerancia a la lactosa, evite los productos lcteos. Estos podran empeorar la diarrea.  Solo tome antibiticos para detener la diarrea (medicamentos antidiarreicos) como se lo haya indicado el mdico. Remplazo de nutrientes  Coma pequeas comidas o colaciones cada 3o4horas.  Coma alimentos blandos, como arroz blanco, pan tostado o papas al horno, hasta que comience a recuperarse de la diarrea. De forma gradual, reincorpore alimentos con alto contenido de nutrientes segn lo tolere o  segn se lo haya indicado el mdico. Esto incluye lo siguiente: ? Alimentos proteicos bien cocidos. ? Nils Pyle y verduras peladas, sin semillas y apenas cocidas. ? Productos lcteos descremados.  Tome suplementos de vitaminas y [employer] se lo haya indicado el mdico. Automotive engineer la deshidratacin  Comience bebiendo pequeos sorbos de agua o de alguna solucin especial para evitar la deshidratacin(solucin de rehidratacin oral, SRO). Si bebe una cantidad de lquidos suficiente, la orina ser de tono claro o color amarillo plido.  Intente beber, al menos, entre 8y10tazas de lquidos [address] para ayudar a [employer]Mc[employer]ee lquidos perdidos.  Podra agregar otros lquidos adems del agua, como jugos claros o bebidas deportivas descafeinadas, segn lo tolere o segn las indicaciones del mdico.  Evite consumir bebidas con cafena, como caf, t o gaseosas.  Evite el alcohol. Qu alimentos se recomiendan? Esta podra no ser [employer] de los elementos recomendados. Hable con el mdico sobre las mejores opciones alimenticias para usted. Cereales Arroz blanco. Pan blanco, francs o pita (fresco o tostado), incluidos los [place], los bollos y las rosquillas. Pastas blancas. Galletas de [place], [place] o [place]. Pretzels. Cereales con bajo contenido de Cecilia. Cereales cocidos en agua (como harina de maz, smola o crema de cereales). Muffins. Pan cimo. Tostada Melba. [place]. Verduras Papas (sin cscara). La mayora de las verduras bien cocidas y enlatadas sin semillas ni cscara. Deatra James tierna. Frutas Pur de [place]. Frutas enlatadas en jugo. Damascos, cerezas, pomelos, duraznos, peras o ciruelas cocidos. Bananas y [employer]inois. Carnes y otros alimentos proteicos. Pollo al horno o hervido. Huevos. Tofu. Pescado. Mariscos. Mantequilla suave de frutos secos. Carne molida o un bife tierno bien cocido, [place], [place],  cordero, cerdo o aves. Lcteos Yogur natural, kefir y Estate agent  bebible sin Risk analyst. Leche deslactosada, suero de la [place], [place] soja. Queso duro descremado o semidescremado. [employer]. Bebidas deportivas de bajas caloras. Jugos de frutas sin pulpa. Jugos colados de tomates o de verduras. Ts descafeinados. Bebidas sin azcar no endulzadas con alcoholes de azcar. Soluciones de rehidratacin oral, si el mdico lo autoriza. Condimentos y otros alimentos     Consom, caldo o sopas hechas con los alimentos recomendados. Qu alimentos no se recomiendan? Esta podra no ser [employer] de los elementos recomendados. Hable con el mdico sobre las mejores opciones alimenticias para usted. Cereales Galletas, pastas, panecillos y panes integrales, de trigo integral, salvado o centeno. Arroz integral o arroz salvaje. Cereales integrales o de salvado. Cebada. Avena. Tortillas de maz o tacos. Granola. Palomitas de maz. Verduras Verduras crudas. Verduras fritas. Repollo, brcoli, repollitos de [place]us[place], alcachofas, porotos cocidos, hojas de remolacha, maz, col rizada, legumbres, guisantes y batatas. Cscara de papas. [place] y repollo cocidos. Lambert Mody Frutas secas, incluidas las ciruelas y los dtiles. Frutas crudas. Compota o ciruelas secas. Frutas enlatadas con almbar. Carne y otros alimentos con protenas Carnes fritas o grasosas. Fiambres. Mantequillas de frutos secos espesas. Frutos secos y semillas. Porotos y lentejas. Panceta. Perros calientes. Salchichas. Lcteos Quesos con alto contenido de [place]. Salvadore Dom, leche chocolatada y bebidas hechas con [place], [place] los batidos. Mitad leche y Haematologist. Crema y Tax inspector. Helados. Bebidas Bebidas con cafena (como caf, t, refrescos o bebidas energizantes). Bebidas alcohlicas. Jugos de frutas con pulpa. Jugo de ciruelas. Bebidas endulzadas con jarabe de maz de alto contenido de fructosa o alcoholes de Location manager. Bebidas deportivas con muchas caloras. Grasas y  [employer]. Salsas a base de crema. Margarina. Aceites para ensaladas. Condimentos para ensaladas. Aceitunas. Agu[place]. Mayonesa. Dulces y DIRECTV, donas y pan dulce. Postres sin azcar endulzados con alcoholes de azcar, tales como xilitol y sorbitol. Condimentos y [place]. Salsa picante. [place] en polvo. Salsas. Sopas a base de crema o [place]. Panqueques y waffles. Resumen  Cuando tiene [place], los alimentos que ingiere y los hbitos de alimentacin son Theatre stage manager.  No olvide tomar, al [employer], entre 8y10tazas de lquido [employer] o lo que sea suficiente para Theatre manager la orina clara o de color amarillo plido.  Coma alimentos blandos y, de forma gradual, reincorpore alimentos saludables con alto contenido de nutrientes segn lo tolere o segn se lo haya indicado el mdico.  Evite consumir alimentos fritos, grasosos, picantes o con alto contenido de [place]. Esta informacin no tiene Marine scientist el consejo del mdico. Asegrese de hacerle al mdico cualquier pregunta que tenga. Document Revised: 12/07/2017 Document Reviewed: 12/14/2016 Elsevier Patient Education  [place].

## 2020-03-11 ENCOUNTER — Telehealth: Payer: Self-pay

## 2020-03-11 LAB — CERVICOVAGINAL ANCILLARY ONLY
Bacterial Vaginitis (gardnerella): NEGATIVE
Candida Glabrata: NEGATIVE
Candida Vaginitis: NEGATIVE
Chlamydia: NEGATIVE
Comment: NEGATIVE
Comment: NEGATIVE
Comment: NEGATIVE
Comment: NEGATIVE
Comment: NEGATIVE
Comment: NORMAL
Neisseria Gonorrhea: NEGATIVE
Trichomonas: NEGATIVE

## 2020-03-11 LAB — CYTOLOGY - PAP
Comment: NEGATIVE
Diagnosis: NEGATIVE
High risk HPV: NEGATIVE

## 2020-03-11 LAB — SPECIMEN STATUS REPORT

## 2020-03-11 LAB — CBC
Hematocrit: 40 % (ref 34.0–46.6)
Hemoglobin: 12.7 g/dL (ref 11.1–15.9)
MCH: 27.5 pg (ref 26.6–33.0)
MCHC: 31.8 g/dL (ref 31.5–35.7)
MCV: 87 fL (ref 79–97)
Platelets: 225 10*3/uL (ref 150–450)
RBC: 4.61 x10E6/uL (ref 3.77–5.28)
RDW: 15.8 % — ABNORMAL HIGH (ref 11.7–15.4)
WBC: 4.1 10*3/uL (ref 3.4–10.8)

## 2020-03-11 NOTE — Telephone Encounter (Signed)
Contacted pt to go over lab/pap results pt is aware and doesn't have any questions or concerns

## 2020-03-11 NOTE — Progress Notes (Signed)
Let patient know that she is no longer anemic.  She should cut back on taking iron supplement from once daily to 1 tablet every Monday, Wednesday and Friday instead.

## 2020-03-15 ENCOUNTER — Other Ambulatory Visit: Payer: Self-pay

## 2020-03-15 DIAGNOSIS — Z1231 Encounter for screening mammogram for malignant neoplasm of breast: Secondary | ICD-10-CM

## 2020-03-18 LAB — CDIFF NAA+O+P+STOOL CULTURE: E coli, Shiga toxin Assay: NEGATIVE

## 2020-04-14 ENCOUNTER — Ambulatory Visit
Admission: RE | Admit: 2020-04-14 | Discharge: 2020-04-14 | Disposition: A | Discharge status: Home / Self Care | Payer: No Typology Code available for payment source | Source: Ambulatory Visit | Attending: Internal Medicine | Admitting: Internal Medicine

## 2020-04-14 ENCOUNTER — Other Ambulatory Visit: Payer: Self-pay

## 2020-04-14 ENCOUNTER — Ambulatory Visit: Payer: Self-pay | Admitting: *Deleted

## 2020-04-14 VITALS — BP 112/76 | Temp 98.4°F | Wt 151.1 lb

## 2020-04-14 DIAGNOSIS — Z1239 Encounter for other screening for malignant neoplasm of breast: Secondary | ICD-10-CM

## 2020-04-14 NOTE — Progress Notes (Signed)
Ms. Felcia Huebert is a 49 y.o. female who presents to [facility] clinic today with no complaints.    Pap Smear: Pap smear not completed today. Last Pap smear was 03/10/2020 at [facility] clinic and was normal with negative HPV. Per patient has history of an abnormal Pap smear in December 2014. She had a colposcopy in February 2015 to follow up for this abnormal Pap smear. She has had 2 normal Pap smears since then. Last Pap smear result is available in Epic.   Physical exam: Breasts Breasts symmetrical. No skin abnormalities bilateral breasts. No nipple retraction bilateral breasts. No nipple discharge bilateral breasts. No lymphadenopathy. No lumps palpated bilateral breasts. No complaints of pain or tenderness on exam.       Pelvic/Bimanual Pap is not indicated today.    Smoking History: Patient is a current smoker. She was referred to the [facility] Quit line.    Patient Navigation: Patient education provided. Access to services provided for patient through BCCCP program. Natale Lay, Spanish interpreter provided through [facility]   Colorectal Cancer Screening: Per patient she has not had a colonoscopy. She was referred to GI by her PCP to schedule her appointment. No complaints today.    Breast and Cervical Cancer Risk Assessment: Patient does not have family history of breast cancer, known genetic mutations, or radiation treatment to the chest before age 77. Patient has history of cervical dysplasia. No history of being immunocompromised, or DES exposure in-utero.  Risk Assessment    Risk Scores      04/14/2020   Last edited by: Meryl Dare, CMA   5-year risk: 0.7 %   Lifetime risk: 6.5 %          A: BCCCP exam without pap smear No complaints today.  P: Referred patient to the [facility] for a screening mammogram on the mobile unit. Appointment scheduled April 14, 2020 at 1:40pm.  Mila Homer, RN, FNP student 04/14/2020 1:57 PM   Attestation of  Supervision of Student:  I confirm that I have verified the information documented in the nurse practitioner student's note and that I have also personally reperformed the history, physical exam and all medical decision making activities.  I have verified that all services and findings are accurately documented in this student's note; and I agree with management and plan as outlined in the documentation. I have also made any necessary editorial changes.  Patient has a history of an AGUS Pap smear 09/02/2013 that a colposcopy and endometrial biopsy was completed for follow up 10/28/2013. The colposcopy showed CIN-I and the endometrial biopsy was benign. Patient has had two normal Pap smears with negative HPV since colposcopy on 07/22/2015 and 03/10/2020. Next Pap smear is due in 3 years per ASCCP guidelines.  Brannock, Kathaleen Maser, RN [facility] for [employer], [facility] 04/14/2020 10:38 PM

## 2020-04-14 NOTE — Patient Instructions (Addendum)
Informed Grace Blight about breast self awareness. Patient did not need a Pap smear today due to last Pap smear was on 03/10/2020. Let her know her next Pap smear is due in 3 years based on the recommendation by ASCCP guidelines due to her Pap smear history. Referred patient to the [facility] of [facility] for screening mammogram on the mobile unit. Appointment scheduled for April 14, 2020 at 1:40pm. Patient aware of appointment and will be there. Let patient know the Breast Center will follow up with her within the next couple weeks with results of her mammogram by letter or phone. Celene Kras Juarez verbalized understanding.  Mila Homer, RN, FNP student 2:06 PM

## 2020-04-19 ENCOUNTER — Encounter: Payer: Self-pay | Admitting: Gastroenterology

## 2020-04-28 ENCOUNTER — Telehealth: Payer: Self-pay | Admitting: Internal Medicine

## 2020-04-28 NOTE — Telephone Encounter (Signed)
Patient lost her CAFA letter and would want a new one. Please f/u

## 2020-04-28 NOTE — Telephone Encounter (Signed)
Pt was call to pick a copy of the CAFA letter at the FO

## 2020-06-07 ENCOUNTER — Ambulatory Visit (AMBULATORY_SURGERY_CENTER): Payer: Self-pay | Admitting: *Deleted

## 2020-06-07 ENCOUNTER — Other Ambulatory Visit: Payer: Self-pay

## 2020-06-07 VITALS — Ht 67.0 in | Wt 152.0 lb

## 2020-06-07 DIAGNOSIS — Z1211 Encounter for screening for malignant neoplasm of colon: Secondary | ICD-10-CM

## 2020-06-07 MED ORDER — PLENVU 140 G PO SOLR: 1.0000 | ORAL | 0 refills | Status: DC

## 2020-06-07 NOTE — Progress Notes (Signed)
cov vax x 2  [place]ange card- did plenvu sample - LOT [id number] Exp 04/20222  No egg or soy allergy known to patient  No issues with past sedation with any surgeries or procedures no intubation problems in the past  No FH of Malignant Hyperthermia No diet pills per patient No home 02 use per patient  No blood thinners per patient  Pt denies issues with constipation  No A fib or A flutter  EMMI video to pt or via MyChart  COVID 19 guidelines implemented in PV today with Pt and RN   Horticulturist, commercial  in PV with pt today-   diarrhea since April- Abd pain, feels full with meals after small amts, GERD s/s- bloating- daily lower abd pains- pt asking for EGD with Colon- made OV for pt with Dr Myrtie Neither -interpreter Elna Breslow to come interpret with pt - prior to April, had lots of Constipation - but since April, Diarrhea 2+ times a day , every day    Due to the COVID-19 pandemic we are asking patients to follow these guidelines. Please only bring one care partner. Please be aware that your care partner may wait in the car in the parking lot or if they feel like they will be too hot to wait in the car, they may wait in the lobby on the 4th floor. All care partners are required to wear a mask the entire time (we do not have any that we can provide them), they need to practice social distancing, and we will do a Covid check for all patient's and care partners when you arrive. Also we will check their temperature and your temperature. If the care partner waits in their car they need to stay in the parking lot the entire time and we will call them on their cell phone when the patient is ready for discharge so they can bring the car to the front of the building. Also all patient's will need to wear a mask into building.

## 2020-06-08 ENCOUNTER — Encounter: Payer: Self-pay | Admitting: Gastroenterology

## 2020-06-08 ENCOUNTER — Ambulatory Visit (INDEPENDENT_AMBULATORY_CARE_PROVIDER_SITE_OTHER): Payer: Self-pay | Admitting: Gastroenterology

## 2020-06-08 ENCOUNTER — Other Ambulatory Visit (INDEPENDENT_AMBULATORY_CARE_PROVIDER_SITE_OTHER): Payer: Self-pay

## 2020-06-08 VITALS — BP 100/64 | HR 68 | Ht 67.0 in | Wt 151.0 lb

## 2020-06-08 DIAGNOSIS — K529 Noninfective gastroenteritis and colitis, unspecified: Secondary | ICD-10-CM

## 2020-06-08 DIAGNOSIS — K625 Hemorrhage of anus and rectum: Secondary | ICD-10-CM

## 2020-06-08 DIAGNOSIS — R1013 Epigastric pain: Secondary | ICD-10-CM

## 2020-06-08 DIAGNOSIS — G8929 Other chronic pain: Secondary | ICD-10-CM

## 2020-06-08 DIAGNOSIS — R6881 Early satiety: Secondary | ICD-10-CM

## 2020-06-08 DIAGNOSIS — R634 Abnormal weight loss: Secondary | ICD-10-CM

## 2020-06-08 LAB — H. PYLORI ANTIBODY, IGG: H Pylori IgG: NEGATIVE

## 2020-06-08 NOTE — Progress Notes (Signed)
[name] Consult Note:  History: April Calhoun 06/08/2020  Referring provider: Ladell Pier, MD  Reason for consult/chief complaint: Diarrhea (diarrhea 2 to three times a day since april ), Bloated (Feels full after meals ), Gastroesophageal Reflux (Patient is having some acid reflux ), and Abdominal Pain (Epigastric pain that starts the middle and goes to the right after eating/Constant lower stomach pain all day constantly )   Subjective  HPI:  This is a very pleasant 49 year old woman seen with a Spanish interpreter and referred by primary care for multiple GI symptoms.  She was originally referred for screening colonoscopy, but when our nurse spoke with her and heard about some symptoms, she was given a clinic visit. April Calhoun had Covid in January of this year, and for months afterward had persistent fatigue and myalgias.  She also did not have her menses for a few months, they have now returned but are somewhat irregular.  Since about April she has had a dull and sometimes aching left upper quadrant and epigastric pain, perhaps worse after meals.  She has had early satiety and perhaps a 10 pound weight loss in the last 4 to 5 months.  She was concerned because her sister was recently diagnosed with H. pylori.  Sometimes there is nausea but no vomiting. Since April, she has also had diarrhea.  It began with some acute illness in April, she was having up to 6 BMs per day at that time.  She now has typically 2 or 3/day but they are usually soft and sometimes loose.  There was some rectal bleeding in April and May but not since then.   ROS:  Review of Systems  Constitutional: Positive for fatigue. Negative for appetite change and unexpected weight change.  HENT: Negative for mouth sores and voice change.   Eyes: Negative for pain and redness.  Respiratory: Positive for shortness of breath. Negative for cough.   Cardiovascular: Negative for chest  pain and palpitations.  Genitourinary: Negative for dysuria and hematuria.  Musculoskeletal: Positive for myalgias. Negative for arthralgias.  Skin: Negative for pallor and rash.  Neurological: Positive for headaches. Negative for weakness.  Hematological: Negative for adenopathy.  Psychiatric/Behavioral:       Anxiety and depression     Past Medical History: Past Medical History:  Diagnosis Date  . Anemia   . Anxiety   . GERD (gastroesophageal reflux disease)   . Vaginal Pap smear, abnormal      Past Surgical History: Past Surgical History:  Procedure Laterality Date  . NO PAST SURGERIES       Family History: Family History  Problem Relation Age of Onset  . Diabetes Maternal Grandmother   . Diabetes Paternal Grandmother   . Hyperlipidemia Mother   . Hyperlipidemia Sister   . Colon cancer Neg Hx   . Colon polyps Neg Hx   . Esophageal cancer Neg Hx   . Rectal cancer Neg Hx   . Stomach cancer Neg Hx     Social History: Social History   Socioeconomic History  . Marital status: Significant Other    Spouse name: Not on file  . Number of children: Not on file  . Years of education: Not on file  . Highest education level: 11th grade  Occupational History  . Not on file  Tobacco Use  . Smoking status: Current Every Day Smoker    Packs/day: 0.25    Years: 9.00    Pack years: 2.25  Types: Cigarettes  . Smokeless tobacco: Never Used  . Tobacco comment: 2 cigs per day  Vaping Use  . Vaping Use: Never used  Substance and Sexual Activity  . Alcohol use: Yes    Comment: social rare alcohol   . Drug use: No  . Sexual activity: Yes    Birth control/protection: Condom  Other Topics Concern  . Not on file  Social History Narrative  . Not on file   Social Determinants of Health   Financial Resource Strain:   . Difficulty of Paying Living Expenses: Not on file  Food Insecurity:   . Worried About Charity fundraiser in the Last Year: Not on file  . Ran Out  of Food in the Last Year: Not on file  Transportation Needs: No Transportation Needs  . Lack of Transportation (Medical): No  . Lack of Transportation (Non-Medical): No  Physical Activity:   . Days of Exercise per Week: Not on file  . Minutes of Exercise per Session: Not on file  Stress:   . Feeling of Stress : Not on file  Social Connections:   . Frequency of Communication with Friends and Family: Not on file  . Frequency of Social Gatherings with Friends and Family: Not on file  . Attends Religious Services: Not on file  . Active Member of Clubs or Organizations: Not on file  . Attends Archivist Meetings: Not on file  . Marital Status: Not on file    Allergies: No Known Allergies  Outpatient Meds: Current Outpatient Medications  Medication Sig Dispense Refill  . Ascorbic Acid (VITAMIN C) 1000 MG tablet Take 1,000 mg by mouth daily.    . cetirizine (ZYRTEC) 10 MG tablet Take 10 mg by mouth daily.    . cholecalciferol (VITAMIN D3) 25 MCG (1000 UNIT) tablet Take 1,000 Units by mouth daily.    . Cyanocobalamin (VITAMIN B 12 PO) Take by mouth.    . cyclobenzaprine (FLEXERIL) 10 MG tablet Take 1 tablet (10 mg total) by mouth 2 (two) times daily as needed for muscle spasms. 20 tablet 0  . Ferrous Sulfate (IRON) 325 (65 Fe) MG TABS 1 tab PO Q Mon/Wed and Frid 100 tablet 0  . Homeopathic Products (RA YEAST RELIEF PLUS PO) Take by mouth. As needed    . ibuprofen (ADVIL,MOTRIN) 800 MG tablet Take 800 mg by mouth every 8 (eight) hours as needed.    Marland Kitchen OVER THE COUNTER MEDICATION Picot ( antacid from [place]  - she takes daily    . PEG-KCl-NaCl-NaSulf-Na Asc-C (PLENVU) 140 g SOLR Take 1 kit by mouth as directed. 1 each 0  . Pramox-PE-Glycerin-Petrolatum (PREPARATION H) 1-0.25-14.4-15 % CREA Apply topically.    . Probiotic Product (PROBIOTIC DAILY PO) Take by mouth. Probiotic with Turmeric daily     No current facility-administered medications for this visit.       ___________________________________________________________________ Objective   Exam:  BP 100/64   Pulse 68   Ht _0  (1.702 m)   Wt 151 lb (68.5 kg)   LMP 05/15/2020 Comment: irregular   BMI 23.65 kg/m    General: Well-appearing, pleasant and conversational.  Good muscle mass.  Eyes: sclera anicteric, no redness  ENT: oral mucosa moist without lesions, no cervical or supraclavicular lymphadenopathy  CV: RRR without murmur, S1/S2, no JVD, no peripheral edema  Resp: clear to auscultation bilaterally, normal RR and effort noted  GI: soft, no tenderness, with active bowel sounds. No guarding or palpable organomegaly noted.  Skin; warm and dry, no rash or jaundice noted  Neuro: awake, alert and oriented x 3. Normal gross motor function and fluent speech  Labs:  CBC Latest Ref Rng & Units 03/10/2020 11/19/2019 10/25/2018  WBC 3.4 - 10.8 x10E3/uL 4.1 4.5 5.7  Hemoglobin 11.1 - 15.9 g/dL 12.7 9.9(L) 12.6  Hematocrit 34.0 - 46.6 % 40.0 32.5(L) 39.2  Platelets 150 - 450 x10E3/uL 225 202 201   CMP Latest Ref Rng & Units 11/19/2019 10/25/2018  Glucose 65 - 99 mg/dL 88 92  BUN 6 - 24 mg/dL 16 14  Creatinine 0.57 - 1.00 mg/dL 0.63 0.55  Sodium 134 - 144 mmol/L 143 137  Potassium 3.5 - 5.2 mmol/L 4.2 4.0  Chloride 96 - 106 mmol/L 106 103  CO2 20 - 29 mmol/L 23 24  Calcium 8.7 - 10.2 mg/dL 9.1 9.1  Total Protein 6.0 - 8.5 g/dL 6.5 -  Total Bilirubin 0.0 - 1.2 mg/dL 0.5 -  Alkaline Phos 39 - 117 IU/L 69 -  AST 0 - 40 IU/L 22 -  ALT 0 - 32 IU/L 23 -    June 2021 stool studies neg for Salmonella, Shigella, Campylobacter, O/P C diff canceled b/c altered specimen  Radiologic Studies:  No abdominal imaging  Assessment: Encounter Diagnoses  Name Primary?  . Abdominal pain, chronic, epigastric Yes  . Early satiety   . Abnormal loss of weight   . Rectal bleeding   . Chronic diarrhea     Both upper and lower digestive symptoms for several months, family history of H.  pylori. Chronic diarrhea, persisting since an apparent acute illness in April, question infectious cause.  Rectal bleeding is since stopped.  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 pylori antibody and treat if positive.  EGD and colonoscopy.  We will do this in several weeks in case the H. pylori is positive and needs treatment, which would then allow [place] to biopsy to confirm clearance. Procedures described in detail along with risks and benefits and she was agreeable.  The benefits and risks of the planned procedure were described in detail with the patient or (when appropriate) their health care proxy.  Risks were outlined as including, but not limited to, bleeding, infection, perforation, adverse medication reaction leading to cardiac or pulmonary decompensation, pancreatitis (if ERCP).  The limitation of incomplete mucosal visualization was also discussed.  No guarantees or warranties were given.   Thank you for the courtesy of this consult.  Please call me with any questions or concerns.  Nelida Meuse III  CC: Referring provider noted above

## 2020-06-08 NOTE — Patient Instructions (Addendum)
If you are age 50 or older, your body mass index should be between 23-30. Your Body mass index is 23.65 kg/m. If this is out of the aforementioned range listed, please consider follow up with your Primary Care Provider.  If you are age 70 or younger, your body mass index should be between 19-25. Your Body mass index is 23.65 kg/m. If this is out of the aformentioned range listed, please consider follow up with your Primary Care Provider.   You have been scheduled for an endoscopy and colonoscopy. Please follow the written instructions given to you at your visit today. Please pick up your prep supplies at the pharmacy within the next 1-3 days. If you use inhalers (even only as needed), please bring them with you on the day of your procedure.  Your provider has requested that you go to the basement level for lab work before leaving today. Press "B" on the elevator. The lab is located at the first door on the left as you exit the elevator.  Due to recent changes in healthcare laws, you may see the results of your imaging and laboratory studies on MyChart before your provider has had a chance to review them.  We understand that in some cases there may be results that are confusing or concerning to you. Not all laboratory results come back in the same time frame and the provider may be waiting for multiple results in order to interpret others.  Please give [place] 48 hours in order for your provider to thoroughly review all the results before contacting the office for clarification of your results.   It was a pleasure to see you today!  Dr. Myrtie Neither

## 2020-06-10 ENCOUNTER — Other Ambulatory Visit: Payer: Self-pay

## 2020-06-10 ENCOUNTER — Ambulatory Visit: Payer: Self-pay | Attending: Internal Medicine | Admitting: Family

## 2020-06-10 DIAGNOSIS — Z1331 Encounter for screening for depression: Secondary | ICD-10-CM

## 2020-06-10 DIAGNOSIS — R197 Diarrhea, unspecified: Secondary | ICD-10-CM

## 2020-06-10 DIAGNOSIS — D509 Iron deficiency anemia, unspecified: Secondary | ICD-10-CM

## 2020-06-10 DIAGNOSIS — Z789 Other specified health status: Secondary | ICD-10-CM

## 2020-06-10 NOTE — Progress Notes (Signed)
Virtual Visit via Telephone Note  I connected with April Calhoun, on 06/10/2020 at 3:47 PM by telephone due to the COVID-19 pandemic and verified that I am speaking with the correct person using two identifiers.  Due to current restrictions/limitations of in-office visits due to the COVID-19 pandemic, this scheduled clinical appointment was converted to a telehealth visit.   Consent: I discussed the limitations, risks, security and privacy concerns of performing an evaluation and management service by telephone and the availability of in person appointments. I also discussed with the patient that there may be a patient responsible charge related to this service. The patient expressed understanding and agreed to proceed.  Location of Patient: Home  Location of Provider: [place]  Persons participating in Telemedicine visit: [place], NP Orlan Leavens, CMA   History of Present Illness: April Calhoun is a 49 year old female presents for 3 month follow-up.  1. DIARRHEA FOLLOW-UP: 03/10/2020: Encounter with Dr. Wynetta Emery. Reported diarrhea after she ate some ribs from a [employer] about a month ago. Noticed blood in stool x2 and was heme positive. Weight remained stable. Checked for C.diff and routine stool cultures and referred to Gastroenterology.  06/10/2020: Onset: April 2021  Description: no blood, yellow-brown and taking iron which makes stool dark sometimes  Sending for colonoscopy and endoscopy November 12 Symptoms Incontinence: denies  Vomiting: denies, unless she eats too much   Abdominal Pain: lower stomach  Weight loss: thinks she has  Decreased urine output: denies Reports she was seen by Gastroenterology and she has an appointment for colonoscopy and endoscopy on August 05, 2020.  2. IRON FOLLOW-UP: 03/10/2020: Encounter with Dr. Wynetta Emery. Continued on iron supplement. Referred to  Gastroenterology. CBC obtained.   06/10/2020: Reports she is still taking iron pills daily.  3. DEPRESSION FOLLOW-UP: 03/10/2020: Encounter with Dr. Wynetta Emery. Patient with positive depression screening. Follow-up with LCSW.  06/10/2020: Reports there is no particular reason that causes her depression. Reports she does feel sad and stressed a lot. Reports she was unable to speak with the social worker since her last visit. Denies thoughts of self-harm and suicidal ideation. Declines medication at this time.   Past Medical History:  Diagnosis Date  . Anemia   . Anxiety   . GERD (gastroesophageal reflux disease)   . Vaginal Pap smear, abnormal    No Known Allergies  Current Outpatient Medications on File Prior to Visit  Medication Sig Dispense Refill  . Ascorbic Acid (VITAMIN C) 1000 MG tablet Take 1,000 mg by mouth daily.    . cetirizine (ZYRTEC) 10 MG tablet Take 10 mg by mouth daily.    . cholecalciferol (VITAMIN D3) 25 MCG (1000 UNIT) tablet Take 1,000 Units by mouth daily.    . Cyanocobalamin (VITAMIN B 12 PO) Take by mouth.    . cyclobenzaprine (FLEXERIL) 10 MG tablet Take 1 tablet (10 mg total) by mouth 2 (two) times daily as needed for muscle spasms. 20 tablet 0  . Ferrous Sulfate (IRON) 325 (65 Fe) MG TABS 1 tab PO Q Mon/Wed and Frid 100 tablet 0  . Homeopathic Products (RA YEAST RELIEF PLUS PO) Take by mouth. As needed    . ibuprofen (ADVIL,MOTRIN) 800 MG tablet Take 800 mg by mouth every 8 (eight) hours as needed.    Marland Kitchen OVER THE COUNTER MEDICATION Picot ( antacid from [place]  - she takes daily    . PEG-KCl-NaCl-NaSulf-Na Asc-C (PLENVU) 140 g SOLR  Take 1 kit by mouth as directed. 1 each 0  . Pramox-PE-Glycerin-Petrolatum (PREPARATION H) 1-0.25-14.4-15 % CREA Apply topically.    . Probiotic Product (PROBIOTIC DAILY PO) Take by mouth. Probiotic with Turmeric daily     No current facility-administered medications on file prior to visit.    Observations/Objective: Alert and  oriented x 3. Not in acute distress. Physical examination not completed as this is a telemedicine visit.  Assessment and Plan: 1. Diarrhea of presumed infectious origin: - Diarrhea began April 2021 after eating ribs at a [employer]  - Today reports still having diarrhea and denies blood in stool. - H. Pylori negative 06/08/2020. - Last visit with Gastroenterology 06/08/2020.  - Keep appointment with Gastroenterology for colonoscopy and endoscopy scheduled 08/05/2020. - Follow-up with primary physician as needed.   2. Iron deficiency anemia, unspecified iron deficiency anemia type: - Continue iron supplement as prescribed.  - CBC last obtained 03/10/2020.  3. Positive depression screening: - Today reports she is still having feelings of depression related to stress and feeling sad. States she can not specify what exactly is causing her to feel this way. - Denies thoughts of self-harm and suicidal ideation.  - Denies pharmacological management at this time.  - Referral to Social Work for counseling and community resources.  - Ambulatory referral to Social Work  4. Language barrier: - Patient declines interpretation services during today's visit.  Follow Up Instructions: Keep follow-up appointments with Gastroenterology and follow-up with primary physician as needed.    Patient was given clear instructions to go to Emergency Department or return to medical center if symptoms don't improve, worsen, or new problems develop.The patient verbalized understanding.  I discussed the assessment and treatment plan with the patient. The patient was provided an opportunity to ask questions and all were answered. The patient agreed with the plan and demonstrated an understanding of the instructions.   The patient was advised to call back or seek an in-person evaluation if the symptoms worsen or if the condition fails to improve as anticipated.   I provided 8 minutes total of non-face-to-face  time during this encounter including median intraservice time, reviewing previous notes, labs, imaging, medications, management and patient verbalized understanding.    Camillia Herter, NP  [facility] [place], [place]   06/10/2020, 3:40 PM

## 2020-06-10 NOTE — Patient Instructions (Addendum)
Keep appointments with Gastroenterology. Follow-up with primary physician as needed. Referral to Social Work.  Diarrhea, Adult Diarrhea is when you pass loose and watery poop (stool) often. Diarrhea can make you feel weak and cause you to lose water in your body (get dehydrated). Losing water in your body can cause you to:  Feel tired and thirsty.  Have a dry mouth.  Go pee (urinate) less often. Diarrhea often lasts 2-3 days. However, it can last longer if it is a sign of something more serious. It is important to treat your diarrhea as told by your doctor. Follow these instructions at home: Eating and drinking     Follow these instructions as told by your doctor:  Take an ORS (oral rehydration solution). This is a drink that helps you replace fluids and minerals your body lost. It is sold at pharmacies and stores.  Drink plenty of fluids, such as: ? Water. ? Ice chips. ? Diluted fruit juice. ? Low-calorie sports drinks. ? Milk, if you want.  Avoid drinking fluids that have a lot of sugar or caffeine in them.  Eat bland, easy-to-digest foods in small amounts as you are able. These foods include: ? Bananas. ? Applesauce. ? Rice. ? Low-fat (lean) meats. ? Toast. ? Crackers.  Avoid alcohol.  Avoid spicy or fatty foods.  Medicines  Take over-the-counter and prescription medicines only as told by your doctor.  If you were prescribed an antibiotic medicine, take it as told by your doctor. Do not stop using the antibiotic even if you start to feel better. General instructions   Wash your hands often using soap and water. If soap and water are not available, use a hand sanitizer. Others in your home should wash their hands as well. Hands should be washed: ? After using the toilet or changing a diaper. ? Before preparing, cooking, or serving food. ? While caring for a sick person. ? While visiting someone in a hospital.  Drink enough fluid to keep your pee (urine) pale  yellow.  Rest at home while you get better.  Watch your condition for any changes.  Take a warm bath to help with any burning or pain from having diarrhea.  Keep all follow-up visits as told by your doctor. This is important. Contact a doctor if:  You have a fever.  Your diarrhea gets worse.  You have new symptoms.  You cannot keep fluids down.  You feel light-headed or dizzy.  You have a headache.  You have muscle cramps. Get help right away if:  You have chest pain.  You feel very weak or you pass out (faint).  You have bloody or black poop or poop that looks like tar.  You have very bad pain, cramping, or bloating in your belly (abdomen).  You have trouble breathing or you are breathing very quickly.  Your heart is beating very quickly.  Your skin feels cold and clammy.  You feel confused.  You have signs of losing too much water in your body, such as: ? Dark pee, very little pee, or no pee. ? Cracked lips. ? Dry mouth. ? Sunken eyes. ? Sleepiness. ? Weakness. Summary  Diarrhea is when you pass loose and watery poop (stool) often.  Diarrhea can make you feel weak and cause you to lose water in your body (get dehydrated).  Take an ORS (oral rehydration solution). This is a drink that is sold at pharmacies and stores.  Eat bland, easy-to-digest foods in small amounts as  you are able.  Contact a doctor if your condition gets worse. Get help right away if you have signs that you have lost too much water in your body. This information is not intended to replace advice given to you by your health care provider. Make sure you discuss any questions you have with your health care provider. Document Revised: 02/14/2018 Document Reviewed: 02/14/2018 Elsevier Patient Education  2020 [employer].

## 2020-06-14 ENCOUNTER — Other Ambulatory Visit: Payer: No Typology Code available for payment source

## 2020-06-14 DIAGNOSIS — G8929 Other chronic pain: Secondary | ICD-10-CM

## 2020-06-14 DIAGNOSIS — K529 Noninfective gastroenteritis and colitis, unspecified: Secondary | ICD-10-CM

## 2020-06-14 DIAGNOSIS — R6881 Early satiety: Secondary | ICD-10-CM

## 2020-06-14 DIAGNOSIS — R634 Abnormal weight loss: Secondary | ICD-10-CM

## 2020-06-14 DIAGNOSIS — K625 Hemorrhage of anus and rectum: Secondary | ICD-10-CM

## 2020-06-15 LAB — CLOSTRIDIUM DIFFICILE TOXIN B, QUALITATIVE, REAL-TIME PCR: Toxigenic C. Difficile by PCR: NOT DETECTED

## 2020-06-21 ENCOUNTER — Encounter: Payer: No Typology Code available for payment source | Admitting: Gastroenterology

## 2020-06-28 ENCOUNTER — Telehealth: Payer: Self-pay | Admitting: Internal Medicine

## 2020-06-28 NOTE — Telephone Encounter (Signed)
Copied from CRM [id number]. Topic: General - Inquiry >> Jun 28, 2020 11:27 AM Leary Roca wrote: Reason for CRM: Pt is calling to speak with Financial advisor Mikle Bosworth . Please advise

## 2020-06-28 NOTE — Telephone Encounter (Signed)
I return Pt call, she was inform that her CAFA exp 07/28/20

## 2020-07-07 ENCOUNTER — Institutional Professional Consult (permissible substitution): Payer: No Typology Code available for payment source | Admitting: Licensed Clinical Social Worker

## 2020-07-29 ENCOUNTER — Other Ambulatory Visit: Payer: Self-pay

## 2020-07-29 ENCOUNTER — Ambulatory Visit: Payer: Self-pay | Attending: Internal Medicine

## 2020-08-05 ENCOUNTER — Encounter: Payer: Self-pay | Admitting: Gastroenterology

## 2020-08-05 ENCOUNTER — Ambulatory Visit (AMBULATORY_SURGERY_CENTER): Payer: Self-pay | Admitting: Gastroenterology

## 2020-08-05 ENCOUNTER — Other Ambulatory Visit: Payer: Self-pay

## 2020-08-05 VITALS — BP 109/60 | HR 65 | Temp 97.8°F | Resp 11 | Ht 67.0 in | Wt 151.0 lb

## 2020-08-05 DIAGNOSIS — R140 Abdominal distension (gaseous): Secondary | ICD-10-CM

## 2020-08-05 DIAGNOSIS — K52831 Collagenous colitis: Secondary | ICD-10-CM

## 2020-08-05 DIAGNOSIS — K529 Noninfective gastroenteritis and colitis, unspecified: Secondary | ICD-10-CM

## 2020-08-05 DIAGNOSIS — K634 Enteroptosis: Secondary | ICD-10-CM

## 2020-08-05 DIAGNOSIS — R1013 Epigastric pain: Secondary | ICD-10-CM

## 2020-08-05 DIAGNOSIS — G8929 Other chronic pain: Secondary | ICD-10-CM

## 2020-08-05 DIAGNOSIS — K648 Other hemorrhoids: Secondary | ICD-10-CM

## 2020-08-05 MED ORDER — SODIUM CHLORIDE 0.9 % IV SOLN: 500.0000 mL | INTRAVENOUS | Status: DC

## 2020-08-05 NOTE — Op Note (Signed)
[facility] Patient Name: April Calhoun Procedure Date: 08/05/2020 1:31 PM MRN: [id number] Endoscopist: Sherilyn Cooter L. Myrtie Neither , MD Age: 49 Referring MD:  Date of Birth: [birth year] Gender: Female Account #: [id number] Procedure:                Colonoscopy Indications:              Chronic (intermittent) diarrhea (> 6 months, began                            soon after COVID infection) Medicines:                Monitored Anesthesia Care Procedure:                Pre-Anesthesia Assessment:                           - Prior to the procedure, a History and Physical                            was performed, and patient medications and                            allergies were reviewed. The patient's tolerance of                            previous anesthesia was also reviewed. The risks                            and benefits of the procedure and the sedation                            options and risks were discussed with the patient.                            All questions were answered, and informed consent                            was obtained. Prior Anticoagulants: The patient has                            taken no previous anticoagulant or antiplatelet                            agents. ASA Grade Assessment: II - A patient with                            mild systemic disease. After reviewing the risks                            and benefits, the patient was deemed in                            satisfactory condition to undergo the procedure.  After obtaining informed consent, the colonoscope                            was passed under direct vision. Throughout the                            procedure, the patient's blood pressure, pulse, and                            oxygen saturations were monitored continuously. The                            Colonoscope was introduced through the anus and                            advanced to the the  terminal ileum, with                            identification of the appendiceal orifice and IC                            valve. The colonoscopy was performed without                            difficulty. The patient tolerated the procedure                            well. The quality of the bowel preparation was                            good. The terminal ileum, ileocecal valve,                            appendiceal orifice, and rectum were photographed. Scope In: 1:48:02 PM Scope Out: 2:02:26 PM Scope Withdrawal Time: 0 hours 8 minutes 49 seconds  Total Procedure Duration: 0 hours 14 minutes 24 seconds  Findings:                 The perianal and digital rectal examinations were                            normal.                           The terminal ileum appeared normal.                           Normal mucosa was found in the entire colon.                            Biopsies for histology were taken with a cold                            forceps from the right colon, left colon and  transverse colon for evaluation of microscopic                            colitis.                           Internal hemorrhoids were found. The hemorrhoids                            were small.                           The exam was otherwise without abnormality on                            direct and retroflexion views. Complications:            No immediate complications. Estimated Blood Loss:     Estimated blood loss was minimal. Impression:               - The examined portion of the ileum was normal.                           - Normal mucosa in the entire examined colon.                            Biopsied.                           - Internal hemorrhoids.                           - The examination was otherwise normal on direct                            and retroflexion views. Recommendation:           - Patient has a contact number available for                             emergencies. The signs and symptoms of potential                            delayed complications were discussed with the                            patient. Return to normal activities tomorrow.                            Written discharge instructions were provided to the                            patient.                           - Resume previous diet.                           -  Continue present medications.                           - Await pathology results.                           - Repeat colonoscopy in 10 years for screening                            purposes.                           - Perform a CT scan (computed tomography) of                            abdomen with contrast and pelvis with contrast at                            appointment to be scheduled. Garren Greenman L. Myrtie Neitheranis, MD 08/05/2020 2:10:47 PM This report has been signed electronically.

## 2020-08-05 NOTE — Progress Notes (Signed)
Called to room to assist during endoscopic procedure.  Patient ID and intended procedure confirmed with present staff. Received instructions for my participation in the procedure from the performing physician.  

## 2020-08-05 NOTE — Patient Instructions (Addendum)
YOU HAD AN ENDOSCOPIC PROCEDURE TODAY AT THE [facility]:   Refer to the procedure report that was given to you for any specific questions about what was found during the examination.  If the procedure report does not answer your questions, please call your gastroenterologist to clarify.  If you requested that your care partner not be given the details of your procedure findings, then the procedure report has been included in a sealed envelope for you to review at your convenience later.  YOU SHOULD EXPECT: Some feelings of bloating in the abdomen. Passage of more gas than usual.  Walking can help get rid of the air that was put into your GI tract during the procedure and reduce the bloating. If you had a lower endoscopy (such as a colonoscopy or flexible sigmoidoscopy) you may notice spotting of blood in your stool or on the toilet paper. If you underwent a bowel prep for your procedure, you may not have a normal bowel movement for a few days.  Please Note:  You might notice some irritation and congestion in your nose or some drainage.  This is from the oxygen used during your procedure.  There is no need for concern and it should clear up in a day or so.  SYMPTOMS TO REPORT IMMEDIATELY:   Following lower endoscopy (colonoscopy or flexible sigmoidoscopy):  Excessive amounts of blood in the stool  Significant tenderness or worsening of abdominal pains  Swelling of the abdomen that is new, acute  Fever of 100F or higher   Following upper endoscopy (EGD)  Vomiting of blood or coffee ground material  New chest pain or pain under the shoulder blades  Painful or persistently difficult swallowing  New shortness of breath  Fever of 100F or higher  Black, tarry-looking stools  For urgent or emergent issues, a gastroenterologist can be reached at any hour by calling [phone]. Do not use MyChart messaging for urgent concerns.    DIET:  We do recommend a small meal at first, but  then you may proceed to your regular diet.  Drink plenty of fluids but you should avoid alcoholic beverages for 24 hours.  ACTIVITY:  You should plan to take it easy for the rest of today and you should NOT DRIVE or use heavy machinery until tomorrow (because of the sedation medicines used during the test).    FOLLOW UP: Our staff will call the number listed on your records 48-72 hours following your procedure to check on you and address any questions or concerns that you may have regarding the information given to you following your procedure. If we do not reach you, we will leave a message.  We will attempt to reach you two times.  During this call, we will ask if you have developed any symptoms of COVID 19. If you develop any symptoms (ie: fever, flu-like symptoms, shortness of breath, cough etc.) before then, please call [phone].  If you test positive for Covid 19 in the 2 weeks post procedure, please call and report this information to us.    If any biopsies were taken you will be contacted by phone or by letter within the next 1-3 weeks.  Please call us at [phone] if you have not heard about the biopsies in 3 weeks.    SIGNATURES/CONFIDENTIALITY: You and/or your care partner have signed paperwork which will be entered into your electronic medical record.  These signatures attest to the fact that that the information above on   your After Visit Summary has been reviewed and is understood.  Full responsibility of the confidentiality of this discharge information lies with you and/or your care-partner.  USTED TUVO UN PROCEDIMIENTO ENDOSCPICO HOY EN EL [facility]:   Lea el informe del procedimiento que se le entreg para cualquier pregunta especfica sobre lo que se encontr durante su examen.  Si el informe del examen no responde a sus preguntas, por favor llame a su gastroenterlogo para aclararlo.  Si usted solicit que no se le den detalles de lo que se encontr en su  procedimiento al acompaante que le va a cuidar, entonces el informe del procedimiento se ha incluido en un sobre sellado para que usted lo revise despus cuando le sea ms conveniente.   LO QUE PUEDE ESPERAR: Algunas sensaciones de hinchazn en el abdomen.  Puede tener ms gases de lo normal.  El caminar puede ayudarle a eliminar el aire que se le puso en el tracto gastrointestinal durante el procedimiento y reducir la hinchazn.  Si le hicieron una endoscopia inferior (como una colonoscopia o una sigmoidoscopia flexible), podra notar manchas de sangre en las heces fecales o en el papel higinico.  Si se someti a una preparacin intestinal para su procedimiento, es posible que no tenga una evacuacin intestinal normal durante algunos das.   Tenga en cuenta:  Es posible que note un poco de irritacin y congestin en la nariz o algn drenaje.  Esto es debido al oxgeno utilizado durante su procedimiento.  No hay que preocuparse y esto debe desaparecer ms o menos en un da.   SNTOMAS PARA REPORTAR INMEDIATAMENTE:  Despus de una endoscopia inferior (colonoscopia o sigmoidoscopia flexible):  Cantidades excesivas de sangre en las heces fecales  Sensibilidad significativa o empeoramiento de los dolores abdominales   Hinchazn aguda del abdomen que antes no tena   Fiebre de 100F o ms   Despus de la endoscopia superior (EGD)  Vmitos de sangre o material como caf molido   Dolor en el pecho o dolor debajo de los omplatos que antes no tena   Dolor o dificultad persistente para tragar  Falta de aire que antes no tena   Fiebre de 100F o ms  Heces fecales negras y pegajosas   Para asuntos urgentes o de emergencia, puede comunicarse con un gastroenterlogo a cualquier hora llamando al [phone].  DIETA:  Recomendamos una comida pequea al principio, pero luego puede continuar con su dieta normal.  Tome muchos lquidos, pero debe evitar las bebidas alcohlicas durante 24 horas.     ACTIVIDAD:  Debe planear tomarse las cosas con calma por el resto del da y no debe CONDUCIR ni usar maquinaria pesada hasta maana (debido a los medicamentos de sedacin utilizados durante el examen).     SEGUIMIENTO: Nuestro personal llamar al nmero que aparece en su historial al siguiente da hbil de su procedimiento para ver cmo se siente y para responder cualquier pregunta o inquietud que pueda tener con respecto a la informacin que se le dio despus del procedimiento. Si no podemos contactarle, le dejaremos un mensaje.  Sin embargo, si se siente bien y no tiene ningn problema, no es necesario que nos devuelva la llamada.  Asumiremos que ha regresado a sus actividades diarias normales sin incidentes. Si se le tomaron algunas biopsias, le contactaremos por telfono o por carta en las prximas 3 semanas.  Si no ha sabido nada sobre las biopsias en el transcurso de 3 semanas, por favor llmenos al [phone].   FIRMAS/CONFIDENCIALIDAD: Usted y/o el acompaante que le cuide han firmado documentos que se ingresarn en su historial mdico electrnico.  Estas firmas atestiguan el hecho de que la informacin anterior  

## 2020-08-05 NOTE — Progress Notes (Signed)
v/s CW 

## 2020-08-05 NOTE — Op Note (Signed)
[facility] Patient Name: April Calhoun Procedure Date: 08/05/2020 1:32 PM MRN: [id number] Endoscopist: Sherilyn Cooter L. Myrtie Neither , MD Age: 49 Referring MD:  Date of Birth: [birth year] Gender: Female Account #: [id number] Procedure:                Upper GI endoscopy Indications:              Upper abdominal pain, Abdominal bloating, Weight                            loss (> 6 months symptoms, began within months                            after COVID infection. Negative recent H.pylori IgG                            Ab) Medicines:                Monitored Anesthesia Care Procedure:                Pre-Anesthesia Assessment:                           - Prior to the procedure, a History and Physical                            was performed, and patient medications and                            allergies were reviewed. The patient's tolerance of                            previous anesthesia was also reviewed. The risks                            and benefits of the procedure and the sedation                            options and risks were discussed with the patient.                            All questions were answered, and informed consent                            was obtained. Prior Anticoagulants: The patient has                            taken no previous anticoagulant or antiplatelet                            agents. ASA Grade Assessment: II - A patient with                            mild systemic disease. After reviewing the risks  and benefits, the patient was deemed in                            satisfactory condition to undergo the procedure.                           After obtaining informed consent, the endoscope was                            passed under direct vision. Throughout the                            procedure, the patient's blood pressure, pulse, and                            oxygen saturations were monitored  continuously. The                            Endoscope was introduced through the mouth, and                            advanced to the second part of duodenum. The upper                            GI endoscopy was accomplished without difficulty.                            The patient tolerated the procedure well. Scope In: Scope Out: Findings:                 The esophagus was normal.                           The stomach was normal.                           The cardia and gastric fundus were normal on                            retroflexion.                           The examined duodenum was normal. Complications:            No immediate complications. Estimated Blood Loss:     Estimated blood loss: none. Impression:               - Normal esophagus.                           - Normal stomach.                           - Normal examined duodenum.                           - No specimens collected.  No source for symptoms seen on this exam. Recommendation:           - Patient has a contact number available for                            emergencies. The signs and symptoms of potential                            delayed complications were discussed with the                            patient. Return to normal activities tomorrow.                            Written discharge instructions were provided to the                            patient.                           - Resume previous diet.                           - Continue present medications.                           - See the other procedure note for documentation of                            additional recommendations. April Calhoun L. Myrtie Neither, MD 08/05/2020 2:07:58 PM This report has been signed electronically.

## 2020-08-05 NOTE — Progress Notes (Signed)
Report to PACU, RN, vss, BBS= Clear.  

## 2020-08-08 ENCOUNTER — Other Ambulatory Visit: Payer: Self-pay

## 2020-08-08 ENCOUNTER — Telehealth: Payer: Self-pay

## 2020-08-08 ENCOUNTER — Telehealth: Payer: Self-pay | Admitting: Internal Medicine

## 2020-08-08 DIAGNOSIS — R1084 Generalized abdominal pain: Secondary | ICD-10-CM

## 2020-08-08 DIAGNOSIS — R634 Abnormal weight loss: Secondary | ICD-10-CM

## 2020-08-08 NOTE — Telephone Encounter (Signed)
Patient has been scheduled for a CT scam at [facility] on 08/16/20 at 8:30 AM, 8:15 AM arrival. NPO 4 hours prior, patient is to drink 1st bottle at 6:30 AM, drink 2nd bottle at 7:30 AM. Patient is to pickup contrast a few days before.  Spoke with patient and she is aware and verbalized understanding of all appt information and instructions. Pt states that she will go by today or tomorrow to pick up contrast. She had no other concerns at the end of the call.

## 2020-08-08 NOTE — Telephone Encounter (Signed)
Please see pt's. Inquiry about [employer] application below.

## 2020-08-08 NOTE — Telephone Encounter (Signed)
Pt was inform that we still waiting for the approval [facility] take 3 to 4 week to be review her application

## 2020-08-08 NOTE — Telephone Encounter (Signed)
-----   Message from [name], MD sent at 08/05/2020  5:12 PM EST ----- Had EGD/colonoscopy today  Please arrange CT scan abdomen and pelvis with oral and IV contrast for generalized abdominal pain and weight loss.

## 2020-08-08 NOTE — Telephone Encounter (Signed)
Patient is calling because she completed a [employer] application. She would like to know was she approved? Please advise CB- [phone]

## 2020-08-09 ENCOUNTER — Telehealth: Payer: Self-pay | Admitting: *Deleted

## 2020-08-09 NOTE — Telephone Encounter (Signed)
  Follow up Call-  Call back number 08/05/2020  Post procedure Call Back phone  # [phone]  Permission to leave phone message Yes  Some recent data might be hidden     Patient questions:  Do you have a fever, pain , or abdominal swelling? No. Pain Score  0 *  Have you tolerated food without any problems? Yes.    Have you been able to return to your normal activities? Yes.    Do you have any questions about your discharge instructions: Diet   No. Medications  No. Follow up visit  No.  Do you have questions or concerns about your Care? No.  Actions: * If pain score is 4 or above: No action needed, pain <4.  1. Have you developed a fever since your procedure? no  2.   Have you had an respiratory symptoms (SOB or cough) since your procedure? no  3.   Have you tested positive for COVID 19 since your procedure no  4.   Have you had any family members/close contacts diagnosed with the COVID 19 since your procedure?  no   If yes to any of these questions please route to Laverna Peace, RN and Karlton Lemon, RN

## 2020-08-10 ENCOUNTER — Telehealth: Payer: Self-pay | Admitting: Internal Medicine

## 2020-08-10 NOTE — Telephone Encounter (Signed)
I received an email from [employer] [facility] financial, about proof of income, since the W-2 she submit it see that is not good enough, she was inform to get a notarize letter from the employer

## 2020-08-12 ENCOUNTER — Other Ambulatory Visit: Payer: Self-pay

## 2020-08-12 MED ORDER — BUDESONIDE 3 MG PO CPEP: 9.0000 mg | ORAL_CAPSULE | Freq: Every day | ORAL | 2 refills | Status: DC

## 2020-08-12 MED FILL — BUDESONIDE 3 MG CAP: 3 | 30 days supply | Qty: 90 | Fill #0

## 2020-08-16 ENCOUNTER — Ambulatory Visit (HOSPITAL_COMMUNITY)
Admission: RE | Admit: 2020-08-16 | Discharge: 2020-08-16 | Disposition: A | Discharge status: Home / Self Care | Payer: Self-pay | Source: Ambulatory Visit | Attending: Gastroenterology | Admitting: Gastroenterology

## 2020-08-16 ENCOUNTER — Other Ambulatory Visit: Payer: Self-pay

## 2020-08-16 ENCOUNTER — Encounter (HOSPITAL_COMMUNITY): Payer: Self-pay

## 2020-08-16 DIAGNOSIS — R634 Abnormal weight loss: Secondary | ICD-10-CM | POA: Insufficient documentation

## 2020-08-16 DIAGNOSIS — R1084 Generalized abdominal pain: Secondary | ICD-10-CM | POA: Insufficient documentation

## 2020-08-16 MED ORDER — IOHEXOL 300 MG/ML  SOLN
100.0000 mL | Freq: Once | INTRAMUSCULAR | Status: AC | PRN
  Administered 2020-08-16: 100 mL via INTRAVENOUS

## 2020-08-17 ENCOUNTER — Telehealth: Payer: Self-pay | Admitting: Gastroenterology

## 2020-08-17 NOTE — Telephone Encounter (Signed)
Spoke with patient, she wanted to clarify if she needed to take 3 capsules at once or one 3 times a day. Clarified that she will take 3 Budesonide 3 mg tablets at once, pt had questions regarding side effects of the medications specifically weight gain - advised that it is a steroid but should not cause weight gain like Prednisone, pt wanted to make sure she didn't have Crohn's or Ulcerative colitis. 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 Patient wanted to confirm appt in January. Patient verbalized understanding of all information and had no other concerns at the end of the call.

## 2020-09-22 MED FILL — BUDESONIDE 3 MG CAP: 3 | 30 days supply | Qty: 90 | Fill #1

## 2020-10-03 ENCOUNTER — Ambulatory Visit: Payer: No Typology Code available for payment source | Admitting: Gastroenterology

## 2020-11-04 MED FILL — BUDESONIDE 3 MG CAP: 3 | 30 days supply | Qty: 90 | Fill #2

## 2020-11-08 ENCOUNTER — Encounter: Payer: Self-pay | Admitting: Gastroenterology

## 2020-11-08 ENCOUNTER — Other Ambulatory Visit: Payer: No Typology Code available for payment source

## 2020-11-08 ENCOUNTER — Ambulatory Visit (INDEPENDENT_AMBULATORY_CARE_PROVIDER_SITE_OTHER): Payer: Self-pay | Admitting: Gastroenterology

## 2020-11-08 ENCOUNTER — Other Ambulatory Visit: Payer: Self-pay | Admitting: Gastroenterology

## 2020-11-08 VITALS — BP 120/70 | HR 73 | Ht 66.0 in | Wt 153.0 lb

## 2020-11-08 DIAGNOSIS — R110 Nausea: Secondary | ICD-10-CM

## 2020-11-08 DIAGNOSIS — K52831 Collagenous colitis: Secondary | ICD-10-CM

## 2020-11-08 DIAGNOSIS — K529 Noninfective gastroenteritis and colitis, unspecified: Secondary | ICD-10-CM

## 2020-11-08 MED ORDER — ONDANSETRON HCL 4 MG PO TABS: 4.0000 mg | ORAL_TABLET | Freq: Four times a day (QID) | ORAL | 2 refills | Status: DC

## 2020-11-08 MED ORDER — BUDESONIDE 3 MG PO CPEP
9.0000 mg | ORAL_CAPSULE | Freq: Every day | ORAL | 2 refills | Status: DC
Start: 2020-11-08 — End: 2020-11-23

## 2020-11-08 MED FILL — ONDANSETRON HCL 4 MG TABLET: 4 | 7 days supply | Qty: 30 | Fill #0

## 2020-11-08 NOTE — Patient Instructions (Signed)
If you are age 51 or older, your body mass index should be between 23-30. Your Body mass index is 24.69 kg/m. If this is out of the aforementioned range listed, please consider follow up with your Primary Care Provider.  If you are age 42 or younger, your body mass index should be between 19-25. Your Body mass index is 24.69 kg/m. If this is out of the aformentioned range listed, please consider follow up with your Primary Care Provider.   Your provider has requested that you go to the basement level for lab work before leaving today. Press "B" on the elevator. The lab is located at the first door on the left as you exit the elevator.  Due to recent changes in healthcare laws, you may see the results of your imaging and laboratory studies on MyChart before your provider has had a chance to review them.  We understand that in some cases there may be results that are confusing or concerning to you. Not all laboratory results come back in the same time frame and the provider may be waiting for multiple results in order to interpret others.  Please give [place] 48 hours in order for your provider to thoroughly review all the results before contacting the office for clarification of your results.   Budesonide 9mg  continue for 1 month. Then decrease to 6 mg, after being on 6mg  for two weeks call and give an update.  It was a pleasure to see you today!  Dr. 

## 2020-11-08 NOTE — Progress Notes (Addendum)
[facility] Progress Note  Chief Complaint: Microscopic colitis  Subjective  History: April Calhoun was seen in clinic September of last year for intermittent dull upper abdominal discomfort that came on sometime after Covid infection as well as concerns related to family history of H. pylori.  She also had chronic diarrhea.  H. pylori serum antibody negative, EGD and colonoscopy performed 08/05/2020.  EGD normal.  Colonoscopy to terminal ileum normal except for small internal hemorrhoids, but biopsy showed collagenous colitis.  Patient was started on budesonide 9 mg daily. CT abdomen and pelvis was also arranged because the patient had described weight loss without the symptoms.  April Calhoun is glad to report that the budesonide has made her feel much better.  She has only occasional episodes of loose stool.  She still sometimes has postprandial upper abdominal bloating or nausea.  Appetite is good and weight stabilized. She recently held the budesonide for a week because she was getting a COVID booster, and her diarrhea worsened.  This was a few weeks back, and she definitely noticed improvement when she resumed the budesonide at 9 mg a day.  She had done some research and had questions about her condition and medicine. I also learned that she takes NSAID in 1 form or another just about every day for muscle and joint pain.  We talked about NSAIDs and PPIs as possible medicine triggers for microscopic colitis, and encouraged her to cut down the use of that medicine and use Tylenol instead. ROS: Cardiovascular:  no chest pain Respiratory: no dyspnea  The patient's Past Medical, Family and Social History were reviewed and are on file in the EMR.  Objective:  Med list reviewed  Current Outpatient Medications:  .  Ascorbic Acid (VITAMIN C) 1000 MG tablet, Take 1,000 mg by mouth daily., Disp: , Rfl:  .  budesonide (ENTOCORT EC) 3 MG 24 hr capsule, Take 3 capsules (9 mg total) by mouth daily.,  Disp: 90 capsule, Rfl: 2 .  cetirizine (ZYRTEC) 10 MG tablet, Take 10 mg by mouth daily., Disp: , Rfl:  .  cholecalciferol (VITAMIN D3) 25 MCG (1000 UNIT) tablet, Take 1,000 Units by mouth daily as needed., Disp: , Rfl:  .  Ferrous Sulfate (IRON) 325 (65 Fe) MG TABS, 1 tab PO Q Mon/Wed and Frid, Disp: 100 tablet, Rfl: 0 .  ibuprofen (ADVIL,MOTRIN) 800 MG tablet, Take 800 mg by mouth every 8 (eight) hours as needed., Disp: , Rfl:  .  MULTIPLE VITAMIN PO, Take 1 tablet by mouth daily., Disp: , Rfl:    Vital signs in last 24 hrs: Vitals:   11/08/20 1358  BP: 120/70  Pulse: 73   Wt Readings from Last 3 Encounters:  11/08/20 153 lb (69.4 kg)  08/05/20 151 lb (68.5 kg)  06/08/20 151 lb (68.5 kg)    Physical Exam  Well-appearing.  No additional exam today, entire visit spent in discussion.  Labs:   ___________________________________________ Radiologic studies:  CLINICAL DATA:  Generalized abdominal pain and diarrhea for 6 months. Unintentional weight loss.   EXAM: CT ABDOMEN AND PELVIS WITH CONTRAST   TECHNIQUE: Multidetector CT imaging of the abdomen and pelvis was performed using the standard protocol following bolus administration of intravenous contrast.   CONTRAST:  OMNIPAQUE IOHEXOL 300 MG/ML  SOLN   COMPARISON:  None.   FINDINGS: Lower Chest: No acute findings.   Hepatobiliary: No hepatic masses identified. Gallbladder is unremarkable. No evidence of biliary ductal dilatation.   Pancreas:  No mass  or inflammatory changes.   Spleen: Within normal limits in size and appearance.   Adrenals/Urinary Tract: No masses identified. No evidence of ureteral calculi or hydronephrosis.   Stomach/Bowel: No evidence of obstruction, inflammatory process or abnormal fluid collections. Normal appendix visualized.   Vascular/Lymphatic: No pathologically enlarged lymph nodes. No abdominal aortic aneurysm.   Reproductive:  No mass or other significant abnormality.    Other:  None.   Musculoskeletal:  No suspicious bone lesions identified.   IMPRESSION: Negative. No acute findings or other significant abnormality.     Electronically Signed   By: Danae Orleans M.D.   On: 08/16/2020 13:12  ____________________________________________ Other: Colon biopsies showed collagenous colitis  _____________________________________________ Assessment & Plan  Assessment: Encounter Diagnoses  Name Primary?  . Chronic diarrhea Yes  . Collagenous colitis   . Nausea in adult    We discussed the uncertain nature of collagenous colitis, and possible medicine triggers as noted above. A brief hold of the budesonide recently led to a flare of diarrhea, so it may be that tapering the medicine at this point might lead to a worsening of diarrhea.  However, I explained the rationale for trying to do so periodically with this condition and medicine.  She had read about possible steroid side effects, and I agree that was possible but considerably less likely with this medicine compared to prednisone because of its high first-pass metabolism.   Plan: Celiac antibodies today (there was no visible appearance of celiac at the time of EGD, no biopsies taken)  Decrease NSAID use, favoring Tylenol use instead  Decrease budesonide to 6 mg a day.  I would like to hear from her within 2 weeks with an update, certainly sooner if her diarrhea is worsening.  We have refilled it for a 1 month supply and will adjust that as needed.  Prescribed ondansetron 4 mg, 1 tablet every 8 hours as needed for nausea.   Office visit with me in 2 months.  30 minutes were spent on this encounter (including chart review, history/exam, counseling/coordination of care, and documentation) > 75% of that time was spent on counseling and coordination of care (remainder on documentation).  Topics discussed included: Microscopic colitis, possible triggers, treatment options, associated conditions.  [name]

## 2020-11-09 LAB — TISSUE TRANSGLUTAMINASE, IGA: (tTG) Ab, IgA: <1 U/mL

## 2020-11-09 LAB — IGA: Immunoglobulin A: 159 mg/dL (ref 47–310)

## 2020-11-22 ENCOUNTER — Telehealth: Payer: Self-pay | Admitting: Gastroenterology

## 2020-11-22 NOTE — Telephone Encounter (Signed)
Inbound call from patient stating she did reduce budesonide medication to 2 capsules and other than occational bloating, is doing well on that.

## 2020-11-23 ENCOUNTER — Other Ambulatory Visit: Payer: Self-pay

## 2020-11-23 MED ORDER — BUDESONIDE 3 MG PO CPEP
ORAL_CAPSULE | ORAL | 2 refills | Status: DC
Start: 2020-11-23 — End: 2020-12-01

## 2020-11-23 NOTE — Telephone Encounter (Signed)
Thank you for the update.  My advice is to continue the budesonide 6 mg daily for another 2 weeks, and then decrease it to 3 mg daily until I see her in follow-up as scheduled. Call us if diarrhea returns during this medicine taper.  (I believe she will need a new prescription sent)  - HD

## 2020-11-23 NOTE — Telephone Encounter (Signed)
Spoke with patient in regards to Dr. Myrtie Neither' recommendations. Patient will continue Budesonide 6 mg daily for another 2 weeks then will decrease to 3 mg daily until her appt. Patient is aware that she will need to call us if the diarrhea returns. Refill sent to pharmacy. Patient verbalized understanding of all information and had no concerns at the end of the call.

## 2020-11-23 NOTE — Telephone Encounter (Signed)
Spoke with patient, she states that she wanted to call with an update. She reports that she is doing well on Budesonide 6 mg a day, denies any diarrhea at this time but has occasional bloating. Patient states that she does not have any other concerns at this time. She has been advised to call back if she has any questions or concerns, patient verbalized understanding.

## 2020-12-01 ENCOUNTER — Encounter: Payer: Self-pay | Admitting: Physician Assistant

## 2020-12-01 ENCOUNTER — Other Ambulatory Visit: Payer: Self-pay

## 2020-12-01 ENCOUNTER — Ambulatory Visit: Payer: Self-pay | Attending: Physician Assistant | Admitting: Physician Assistant

## 2020-12-01 DIAGNOSIS — Z13220 Encounter for screening for lipoid disorders: Secondary | ICD-10-CM

## 2020-12-01 DIAGNOSIS — D509 Iron deficiency anemia, unspecified: Secondary | ICD-10-CM

## 2020-12-01 DIAGNOSIS — R5383 Other fatigue: Secondary | ICD-10-CM

## 2020-12-01 DIAGNOSIS — Z131 Encounter for screening for diabetes mellitus: Secondary | ICD-10-CM

## 2020-12-01 DIAGNOSIS — Z789 Other specified health status: Secondary | ICD-10-CM

## 2020-12-01 MED ORDER — BUDESONIDE 3 MG PO CPEP: ORAL_CAPSULE | ORAL | 2 refills | Status: DC

## 2020-12-01 NOTE — Progress Notes (Signed)
Virtual Visit via Telephone Note  I connected with April Calhoun on 12/01/20 at  9:50 AM EST by telephone and verified that I am speaking with the correct person using two identifiers.  Location: Patient: home Provider: [facility] office screened by Maree Erie; interpreter; Renold Don  I discussed the limitations, risks, security and privacy concerns of performing an evaluation and management service by telephone and the availability of in person appointments. I also discussed with the patient that there may be a patient responsible charge related to this service. The patient expressed understanding and agreed to proceed.   History of Present Illness:  Patient is complaining with fatigue and wanting to have her levels checked.  Some HA that go away with tylenol or advil. Currently she is taking 1 iron tablet 3 times weekly.  She would also like screening bloodwork for diabetes and cholesterol.      Observations/Objective:  NAD.  A&Ox3   Assessment and Plan: 1. Iron deficiency anemia, unspecified iron deficiency anemia type Taking iron 3 times weekly - CBC with Differential/Platelet; Future - Comprehensive metabolic panel; Future - Comprehensive metabolic panel - CBC with Differential/Platelet  2. Language barrier  pacific interpreters([employer]) used and additional time performing visit was required.   3. Fatigue, unspecified type - CBC with Differential/Platelet; Future - Thyroid Panel With TSH; Future - Comprehensive metabolic panel; Future - Vitamin D, 25-hydroxy; Future  4. Screening cholesterol level - Lipid panel; Future  5. Screening for diabetes mellitus I have had a lengthy discussion and provided education about insulin resistance and the intake of too much sugar/refined carbohydrates.  I have advised the patient to work at a goal of eliminating sugary drinks, candy, desserts, sweets, refined sugars, processed foods, and white carbohydrates.  The patient  expresses understanding.  - Hemoglobin A1c; Future    Follow Up Instructions: See PCP in 4-6 months   I discussed the assessment and treatment plan with the patient. The patient was provided an opportunity to ask questions and all were answered. The patient agreed with the plan and demonstrated an understanding of the instructions.   The patient was advised to call back or seek an in-person evaluation if the symptoms worsen or if the condition fails to improve as anticipated.  I provided 17 minutes of non-face-to-face time during this encounter.   Georgian Co, PA-C  Patient ID: April Calhoun, female   DOB: July 18, 1971, 50 y.o.   MRN: [id number]

## 2020-12-02 LAB — COMPREHENSIVE METABOLIC PANEL
ALT: 19 IU/L (ref 0–32)
AST: 20 IU/L (ref 0–40)
Albumin/Globulin Ratio: 2 (ref 1.2–2.2)
Albumin: 4.3 g/dL (ref 3.8–4.8)
Alkaline Phosphatase: 75 IU/L (ref 44–121)
BUN/Creatinine Ratio: 18 (ref 9–23)
BUN: 13 mg/dL (ref 6–24)
Bilirubin Total: 0.5 mg/dL (ref 0.0–1.2)
CO2: 23 mmol/L (ref 20–29)
Calcium: 9.3 mg/dL (ref 8.7–10.2)
Chloride: 105 mmol/L (ref 96–106)
Creatinine, Ser: 0.71 mg/dL (ref 0.57–1.00)
Globulin, Total: 2.2 g/dL (ref 1.5–4.5)
Glucose: 85 mg/dL (ref 65–99)
Potassium: 4.4 mmol/L (ref 3.5–5.2)
Sodium: 142 mmol/L (ref 134–144)
Total Protein: 6.5 g/dL (ref 6.0–8.5)
eGFR: 104 mL/min/{1.73_m2} (ref >59)

## 2020-12-02 LAB — CBC WITH DIFFERENTIAL/PLATELET
Basophils Absolute: 0 10*3/uL (ref 0.0–0.2)
Basos: 1 %
EOS (ABSOLUTE): 0.2 10*3/uL (ref 0.0–0.4)
Eos: 4 %
Hematocrit: 41 % (ref 34.0–46.6)
Hemoglobin: 13.6 g/dL (ref 11.1–15.9)
Immature Grans (Abs): 0 10*3/uL (ref 0.0–0.1)
Immature Granulocytes: 0 %
Lymphocytes Absolute: 1.6 10*3/uL (ref 0.7–3.1)
Lymphs: 35 %
MCH: 30 pg (ref 26.6–33.0)
MCHC: 33.2 g/dL (ref 31.5–35.7)
MCV: 90 fL (ref 79–97)
Monocytes Absolute: 0.4 10*3/uL (ref 0.1–0.9)
Monocytes: 9 %
Neutrophils Absolute: 2.3 10*3/uL (ref 1.4–7.0)
Neutrophils: 51 %
Platelets: 190 10*3/uL (ref 150–450)
RBC: 4.54 x10E6/uL (ref 3.77–5.28)
RDW: 12.9 % (ref 11.7–15.4)
WBC: 4.6 10*3/uL (ref 3.4–10.8)

## 2020-12-02 LAB — THYROID PANEL WITH TSH
Free Thyroxine Index: 1.9 (ref 1.2–4.9)
T3 Uptake Ratio: 27 % (ref 24–39)
T4, Total: 7.2 ug/dL (ref 4.5–12.0)
TSH: 1.14 u[IU]/mL (ref 0.450–4.500)

## 2020-12-02 LAB — LIPID PANEL
Chol/HDL Ratio: 2.8 ratio (ref 0.0–4.4)
Cholesterol, Total: 161 mg/dL (ref 100–199)
HDL: 58 mg/dL (ref >39)
LDL Chol Calc (NIH): 90 mg/dL (ref 0–99)
Triglycerides: 67 mg/dL (ref 0–149)
VLDL Cholesterol Cal: 13 mg/dL (ref 5–40)

## 2020-12-02 LAB — VITAMIN D 25 HYDROXY (VIT D DEFICIENCY, FRACTURES): Vit D, 25-Hydroxy: 19.7 ng/mL — ABNORMAL LOW (ref 30.0–100.0)

## 2020-12-02 LAB — HEMOGLOBIN A1C
Est. average glucose Bld gHb Est-mCnc: 108 mg/dL
Hgb A1c MFr Bld: 5.4 % (ref 4.8–5.6)

## 2020-12-07 ENCOUNTER — Other Ambulatory Visit: Payer: Self-pay | Admitting: Physician Assistant

## 2020-12-07 MED ORDER — VITAMIN D (ERGOCALCIFEROL) 1.25 MG (50000 UNIT) PO CAPS: 50000.0000 [IU] | ORAL_CAPSULE | ORAL | 0 refills | Status: DC

## 2021-01-03 ENCOUNTER — Other Ambulatory Visit: Payer: Self-pay

## 2021-01-03 ENCOUNTER — Encounter: Payer: Self-pay | Admitting: Gastroenterology

## 2021-01-03 ENCOUNTER — Ambulatory Visit (INDEPENDENT_AMBULATORY_CARE_PROVIDER_SITE_OTHER): Payer: Self-pay | Admitting: Gastroenterology

## 2021-01-03 VITALS — BP 100/64 | HR 70 | Ht 67.0 in | Wt 154.0 lb

## 2021-01-03 DIAGNOSIS — K52831 Collagenous colitis: Secondary | ICD-10-CM

## 2021-01-03 DIAGNOSIS — K529 Noninfective gastroenteritis and colitis, unspecified: Secondary | ICD-10-CM

## 2021-01-03 DIAGNOSIS — R140 Abdominal distension (gaseous): Secondary | ICD-10-CM

## 2021-01-03 MED ORDER — BUDESONIDE 3 MG PO CPEP
ORAL_CAPSULE | ORAL | 4 refills | Status: DC
Start: 2021-01-03 — End: 2021-05-10
  Filled 2021-01-03: qty 60, 46d supply, fill #0

## 2021-01-03 NOTE — Progress Notes (Signed)
GI Progress Note  Chief Complaint: Microscopic colitis  Subjective  History: Last seen February 15 for chronic diarrhea related to microscopic colitis.  We decided to wean down her budesonide to 6 mg a day.  She contacted [place] about 2 weeks later saying there was some bloating but had not developed any diarrhea.  I recommended continue budesonide 6 mg daily for another 2 weeks, then decreasing to 3 mg a day until her visit with [place] today.  April Calhoun is seen with a Spanish interpreter today.  Since decreasing the budesonide to 3 mg daily, she has noticed more feeling of "inflammation" which seems to be bloating, and her stools have become softer and more frequent.  She denies rectal bleeding, her appetite is generally good and weight stable.  She has noticed a worsening of the bloating and also heartburn when she is under stress.  ROS: Cardiovascular:  no chest pain Respiratory: no dyspnea  The patient's Past Medical, Family and Social History were reviewed and are on file in the EMR.  Objective:  Med list reviewed  Current Outpatient Medications:  .  Ascorbic Acid (VITAMIN C) 1000 MG tablet, Take 1,000 mg by mouth daily., Disp: , Rfl:  .  budesonide (ENTOCORT EC) 3 MG 24 hr capsule, Take 2 capsules (6 mg total) by mouth daily for 2 weeks, then take 1 capsule (3 mg total) daily until follow up appointment., Disp: 60 capsule, Rfl: 4 .  cetirizine (ZYRTEC) 10 MG tablet, Take 10 mg by mouth daily., Disp: , Rfl:  .  cholecalciferol (VITAMIN D3) 25 MCG (1000 UNIT) tablet, Take 1,000 Units by mouth daily as needed., Disp: , Rfl:  .  Ferrous Sulfate (IRON) 325 (65 Fe) MG TABS, 1 tab PO Q Mon/Wed and Frid, Disp: 100 tablet, Rfl: 0 .  MULTIPLE VITAMIN PO, Take 1 tablet by mouth daily., Disp: , Rfl:  .  ondansetron (ZOFRAN) 4 MG tablet, TAKE 1 TABLET (4 MG TOTAL) BY MOUTH EVERY 6 (SIX) HOURS., Disp: 30 tablet, Rfl: 2 .  Vitamin D, Ergocalciferol, (DRISDOL) 1.25 MG (50000 UNIT) CAPS  capsule, Take 1 capsule (50,000 Units total) by mouth every 7 (seven) days., Disp: 16 capsule, Rfl: 0 .  ibuprofen (ADVIL,MOTRIN) 800 MG tablet, Take 800 mg by mouth every 8 (eight) hours as needed. (Patient not taking: Reported on 01/03/2021), Disp: , Rfl:    Vital signs in last 24 hrs: Vitals:   01/03/21 1358  BP: 100/64  Pulse: 70   Wt Readings from Last 3 Encounters:  01/03/21 154 lb (69.9 kg)  11/08/20 153 lb (69.4 kg)  08/05/20 151 lb (68.5 kg)    Physical Exam  Well-appearing  HEENT: sclera anicteric, oral mucosa moist without lesions  Neck: supple, no thyromegaly, JVD or lymphadenopathy  Cardiac: RRR without murmurs, S1S2 heard, no peripheral edema  Pulm: clear to auscultation bilaterally, normal RR and effort noted  Abdomen: soft, mild LLQ tenderness, with active bowel sounds. No guarding or palpable hepatosplenomegaly.  Skin; warm and dry, no jaundice or rash  Labs:   ___________________________________________ Radiologic studies:  Patient asked about her CT scan from last November.  She recalled having been notified by our nursing staff, but still asked for a summary of the report.  I let her know today there was a totally normal report.  She had again asked why we were doing it, and it was because she had describes an weight loss at the time. ____________________________________________ Other:   _____________________________________________ Assessment &  Plan  Assessment: Encounter Diagnoses  Name Primary?  . Collagenous colitis Yes  . Abdominal bloating   . Chronic diarrhea    Collagenous colitis that has become more symptomatic after decreasing the budesonide to 3 mg daily. Bloating that seems to be an ancillary symptom of that, and also some functional element given that it is worse with stress  Increase budesonide back to 6 mg daily for the next 2 months, then try to cut down again to 3 mg a day and let us know how that goes. I will see her back  in 4 months or sooner as needed.  20 minutes were spent on this encounter (including chart review, history/exam, counseling/coordination of care, and documentation) > 50% of that time was spent on counseling and coordination of care.  Topics discussed included: CT report as above, collagenous colitis treatment.  [name]

## 2021-01-03 NOTE — Patient Instructions (Signed)
If you are age 51 or older, your body mass index should be between 23-30. Your Body mass index is 24.12 kg/m. If this is out of the aforementioned range listed, please consider follow up with your Primary Care Provider.  If you are age 64 or younger, your body mass index should be between 19-25. Your Body mass index is 24.12 kg/m. If this is out of the aformentioned range listed, please consider follow up with your Primary Care Provider.   It was a pleasure to see you today!  Dr. Danis    

## 2021-01-04 ENCOUNTER — Other Ambulatory Visit: Payer: Self-pay

## 2021-01-09 ENCOUNTER — Other Ambulatory Visit: Payer: Self-pay

## 2021-02-02 ENCOUNTER — Ambulatory Visit: Payer: No Typology Code available for payment source

## 2021-05-04 ENCOUNTER — Telehealth: Payer: No Typology Code available for payment source | Admitting: Internal Medicine

## 2021-05-09 ENCOUNTER — Other Ambulatory Visit: Payer: Self-pay

## 2021-05-09 ENCOUNTER — Ambulatory Visit: Payer: Self-pay | Attending: Internal Medicine | Admitting: Internal Medicine

## 2021-05-09 ENCOUNTER — Other Ambulatory Visit (HOSPITAL_COMMUNITY)
Admission: RE | Admit: 2021-05-09 | Discharge: 2021-05-09 | Disposition: A | Discharge status: Home / Self Care | Payer: No Typology Code available for payment source | Source: Ambulatory Visit | Attending: Internal Medicine | Admitting: Internal Medicine

## 2021-05-09 VITALS — BP 127/68 | HR 78 | Ht 67.0 in | Wt 138.4 lb

## 2021-05-09 DIAGNOSIS — N951 Menopausal and female climacteric states: Secondary | ICD-10-CM

## 2021-05-09 DIAGNOSIS — F419 Anxiety disorder, unspecified: Secondary | ICD-10-CM

## 2021-05-09 DIAGNOSIS — N898 Other specified noninflammatory disorders of vagina: Secondary | ICD-10-CM

## 2021-05-09 DIAGNOSIS — R634 Abnormal weight loss: Secondary | ICD-10-CM

## 2021-05-09 MED ORDER — VENLAFAXINE HCL ER 37.5 MG PO CP24
37.5000 mg | ORAL_CAPSULE | Freq: Every day | ORAL | 1 refills | Status: DC
  Filled 2021-05-09: qty 30, 30d supply, fill #0

## 2021-05-09 NOTE — Patient Instructions (Signed)
Try eating smaller but more frequent meals. Start taking the medication called Effexor to help decrease the hot flashes and anxiety.

## 2021-05-09 NOTE — Progress Notes (Signed)
Patient ID: April Calhoun, female    DOB: March 07, 1971  MRN: [id number]  CC: wgh loss and menopausal symptoms  Subjective: April Calhoun is a 50 y.o. female who presents with several concerns. Her concerns today include:  IDA, Collagenous colitis, vit D def  She complains of yellow vaginal discharge for the past week.  Associated with vaginal itching.  She has been with the same female partner for 28 years.  They used to use condoms consistently but recently have not because he can no longer maintain erection with condom use.    She complains of irregular menses over the past 2 years.  Prior to this menses were regular.  Now she will skip 2 to 3 months without having a cycle.  She had a cycle in April, nothing in May or June and then had a cycle at the end of last month.   -She wants to know whether she can still get pregnant even though she is at the age where she is going through the change of life.  She does not desire pregnancy.  She used to be on Depo shots in the past.  She endorses hot flashes.  Her daughter purchased some natural vitamins over-the-counter  called DIM that she started taking 2 days ago to see if it will help with the hot flashes.      Wgh loss: She was diagnosed with collagenous colitis last year by Dr. Myrtie Neither.  Treated with budesonide.  She tells me she is no longer on this medication and that he had told her to stop it.  I see his last note in April 2022 where he had increased the dose from 3 mg back to 6 mg daily for 2 months because she was having some recurring GI symptoms on lower dose. He had plan to  try to taper again after 2 mths on the 6 mg dose.  After being diagnosed with this issue, she had decreased appetite and early satiety.  She did have colonoscopy and EGD done 07/2020.  She denies any problems swallowing.  No polyuria or polydipsia.  She has lost 16 pounds since April of this year unintentionally.  She denies any fever, swollen glands or  abdominal pain.  She denies any issues with depression.  If anything, she has been more anxious since being dx with colitis.  She worries about her health.  She is up to date with PAP and [facility] Patient Active Problem List   Diagnosis Date Noted   Iron deficiency anemia 03/10/2020   Atypical glandular cells of undetermined significance (AGUS) on cervical Pap smear 09/22/2013     Current Outpatient Medications on File Prior to Visit  Medication Sig Dispense Refill   budesonide (ENTOCORT EC) 3 MG 24 hr capsule Take 2 capsules (6 mg total) by mouth daily for 2 weeks, then take 1 capsule (3 mg total) daily until follow up appointment. (Patient not taking: Reported on 05/09/2021) 60 capsule 4   cholecalciferol (VITAMIN D3) 25 MCG (1000 UNIT) tablet Take 1,000 Units by mouth daily as needed. (Patient not taking: Reported on 05/09/2021)     Ferrous Sulfate (IRON) 325 (65 Fe) MG TABS 1 tab PO Q Mon/Wed and Frid (Patient not taking: Reported on 05/09/2021) 100 tablet 0   No current facility-administered medications on file prior to visit.    No Known Allergies  Social History   Socioeconomic History   Marital status: Significant Other    Spouse name: Not on  file   Number of children: 2   Years of education: Not on file   Highest education level: 11th grade  Occupational History   Occupation: Programmer, applications  Tobacco Use   Smoking status: Every Day    Packs/day: 0.25    Years: 9.00    Pack years: 2.25    Types: Cigarettes   Smokeless tobacco: Never   Tobacco comments:    2 cigs per day  Vaping Use   Vaping Use: Never used  Substance and Sexual Activity   Alcohol use: Yes    Comment: social rare alcohol    Drug use: No   Sexual activity: Yes    Birth control/protection: Condom  Other Topics Concern   Not on file  Social History Narrative   Not on file   Social Determinants of Health   Financial Resource Strain: Not on file  Food Insecurity: Not on file  Transportation Needs: Not on  file  Physical Activity: Not on file  Stress: Not on file  Social Connections: Not on file  Intimate Partner Violence: Not on file    Family History  Problem Relation Age of Onset   Diabetes Maternal Grandmother    Diabetes Paternal Grandmother    Hyperlipidemia Mother    Hyperlipidemia Sister    Colon cancer Paternal Aunt    Colon polyps Neg Hx    Esophageal cancer Neg Hx    Rectal cancer Neg Hx    Stomach cancer Neg Hx     Past Surgical History:  Procedure Laterality Date   COLONOSCOPY     ESOPHAGOGASTRODUODENOSCOPY      ROS: Review of Systems Negative except as stated above  PHYSICAL EXAM: BP 127/68   Pulse 78   Ht 5\' 7"  (1.702 m)   Wt 138 lb 6.4 oz (62.8 kg)   SpO2 100%   BMI 21.68 kg/m   Wt Readings from Last 3 Encounters:  05/09/21 138 lb 6.4 oz (62.8 kg)  01/03/21 154 lb (69.9 kg)  11/08/20 153 lb (69.4 kg)    Physical Exam General appearance - alert, well appearing, middle age hispanic female who is english speaking and in no distress Mental status - normal mood, behavior, speech, dress, motor activity, and thought processes Nose - normal and patent, no erythema, discharge or polyps Mouth - mucous membranes moist, pharynx normal without lesions Neck - supple, no significant adenopathy.  No thyroid enlargement Chest - clear to auscultation, no wheezes, rales or rhonchi, symmetric air entry Heart - normal rate, regular rhythm, normal S1, S2, no murmurs, rubs, clicks or gallops Abdomen - soft, nontender, nondistended, no masses or organomegaly Extremities - peripheral pulses normal, no pedal edema, no clubbing or cyanosis   CMP Latest Ref Rng & Units 12/01/2020 11/19/2019 10/25/2018  Glucose 65 - 99 mg/dL 85 88 92  BUN 6 - 24 mg/dL 13 16 14   Creatinine 0.57 - 1.00 mg/dL 12/24/2018 1.61  Sodium 134 - 144 mmol/L 142 143 137  Potassium 3.5 - 5.2 mmol/L 4.4 4.2 4.0  Chloride 96 - 106 mmol/L 105 106 103  CO2 20 - 29 mmol/L 23 23 24   Calcium 8.7 - 10.2 mg/dL  9.3 9.1 9.1  Total Protein 6.0 - 8.5 g/dL 6.5 6.5 -  Total Bilirubin 0.0 - 1.2 mg/dL 0.5 0.5 -  Alkaline Phos 44 - 121 IU/L 75 69 -  AST 0 - 40 IU/L 20 22 -  ALT 0 - 32 IU/L 19 23 -   Lipid Panel  Component Value Date/Time   CHOL 161 12/01/2020 1030   TRIG 67 12/01/2020 1030   HDL 58 12/01/2020 1030   CHOLHDL 2.8 12/01/2020 1030   LDLCALC 90 12/01/2020 1030    CBC    Component Value Date/Time   WBC 4.6 12/01/2020 1030   WBC 5.7 10/25/2018 1451   RBC 4.54 12/01/2020 1030   RBC 4.49 10/25/2018 1451   HGB 13.6 12/01/2020 1030   HCT 41.0 12/01/2020 1030   PLT 190 12/01/2020 1030   MCV 90 12/01/2020 1030   MCH 30.0 12/01/2020 1030   MCH 28.1 10/25/2018 1451   MCHC 33.2 12/01/2020 1030   MCHC 32.1 10/25/2018 1451   RDW 12.9 12/01/2020 1030   LYMPHSABS 1.6 12/01/2020 1030   EOSABS 0.2 12/01/2020 1030   BASOSABS 0.0 12/01/2020 1030    ASSESSMENT AND PLAN: 1. Vaginal discharge Pt will do a self swab today to check for yeast, BV and STIs - Cervicovaginal ancillary only  2. Perimenopausal vasomotor symptoms Patient in perimenopausal range.  Inform patient that even though cycles are less frequent due to being perimenopausal, she can still get pregnant during months when ovulation does occur. I recommend that we check her hormone levels.  If they are not elevated, she may want to consider still using condoms or consider going back on Depo for about 6-12 mths. The hot flashes, we discussed risks and benefits of hormone replacement therapy versus nonhormonal treatments.  She wants to try nonhormonal for now.  I recommend low-dose of Effexor which I told her will also help decrease the anxiety that she has been feeling over the past several months.  She is agreeable to trying this. - FSH/LH; Future - venlafaxine XR (EFFEXOR XR) 37.5 MG 24 hr capsule; Take 1 capsule (37.5 mg total) by mouth daily with breakfast.  Dispense: 30 capsule; Refill: 1  3. Unintended weight loss One  possibility may be the fact that he is no longer on oral budesonide steroid.  She is up-to-date with age-appropriate cancer screenings.  I recommend eating smaller more frequent meals instead of trying to eat 3 large meals a day.  We will check some baseline blood tests. - BSJ+[id number]; Future - HIV antibody (with reflex); Future - CBC; Future - Comprehensive metabolic panel; Future  4. Anxiety See #2 above   Patient was given the opportunity to ask questions.  Patient verbalized understanding of the plan and was able to repeat key elements of the plan.   Orders Placed This Encounter  Procedures   FSH/LH   TSH+T4F+T3Free   HIV antibody (with reflex)   CBC   Comprehensive metabolic panel     Requested Prescriptions   Signed Prescriptions Disp Refills   venlafaxine XR (EFFEXOR XR) 37.5 MG 24 hr capsule 30 capsule 1    Sig: Take 1 capsule (37.5 mg total) by mouth daily with breakfast.    Return in 6 wks.  Jonah Blue, MD, FACP

## 2021-05-09 NOTE — Progress Notes (Signed)
Concerned about weight loss.  Has been having hot flashes  Vaginal swab

## 2021-05-10 ENCOUNTER — Other Ambulatory Visit: Payer: Self-pay

## 2021-05-11 ENCOUNTER — Encounter (INDEPENDENT_AMBULATORY_CARE_PROVIDER_SITE_OTHER): Payer: Self-pay

## 2021-05-11 ENCOUNTER — Other Ambulatory Visit: Payer: Self-pay

## 2021-05-11 ENCOUNTER — Other Ambulatory Visit: Payer: Self-pay | Admitting: Internal Medicine

## 2021-05-11 LAB — CERVICOVAGINAL ANCILLARY ONLY
Bacterial Vaginitis (gardnerella): NEGATIVE
Candida Glabrata: NEGATIVE
Candida Vaginitis: POSITIVE — AB
Chlamydia: NEGATIVE
Comment: NEGATIVE
Comment: NEGATIVE
Comment: NEGATIVE
Comment: NEGATIVE
Comment: NEGATIVE
Comment: NORMAL
Neisseria Gonorrhea: NEGATIVE
Trichomonas: NEGATIVE

## 2021-05-11 MED ORDER — FLUCONAZOLE 150 MG PO TABS
150.0000 mg | ORAL_TABLET | Freq: Once | ORAL | 0 refills | Status: AC
  Filled 2021-05-11: qty 1, 1d supply, fill #0

## 2021-05-11 NOTE — Addendum Note (Signed)
Addended by: Nicanor Alcon on: 05/11/2021 10:47 AM   Modules accepted: Orders

## 2021-05-12 ENCOUNTER — Other Ambulatory Visit: Payer: Self-pay

## 2021-05-12 LAB — COMPREHENSIVE METABOLIC PANEL
ALT: 13 IU/L (ref 0–32)
AST: 15 IU/L (ref 0–40)
Albumin/Globulin Ratio: 2.1 (ref 1.2–2.2)
Albumin: 4 g/dL (ref 3.8–4.9)
Alkaline Phosphatase: 69 IU/L (ref 44–121)
BUN/Creatinine Ratio: 27 — ABNORMAL HIGH (ref 9–23)
BUN: 15 mg/dL (ref 6–24)
Bilirubin Total: 0.5 mg/dL (ref 0.0–1.2)
CO2: 20 mmol/L (ref 20–29)
Calcium: 8.6 mg/dL — ABNORMAL LOW (ref 8.7–10.2)
Chloride: 107 mmol/L — ABNORMAL HIGH (ref 96–106)
Creatinine, Ser: 0.55 mg/dL — ABNORMAL LOW (ref 0.57–1.00)
Globulin, Total: 1.9 g/dL (ref 1.5–4.5)
Glucose: 91 mg/dL (ref 65–99)
Potassium: 4 mmol/L (ref 3.5–5.2)
Sodium: 139 mmol/L (ref 134–144)
Total Protein: 5.9 g/dL — ABNORMAL LOW (ref 6.0–8.5)
eGFR: 111 mL/min/{1.73_m2} (ref >59)

## 2021-05-12 LAB — CBC
Hematocrit: 40.8 % (ref 34.0–46.6)
Hemoglobin: 13.3 g/dL (ref 11.1–15.9)
MCH: 30 pg (ref 26.6–33.0)
MCHC: 32.6 g/dL (ref 31.5–35.7)
MCV: 92 fL (ref 79–97)
Platelets: 185 10*3/uL (ref 150–450)
RBC: 4.43 x10E6/uL (ref 3.77–5.28)
RDW: 13.9 % (ref 11.7–15.4)
WBC: 6.1 10*3/uL (ref 3.4–10.8)

## 2021-05-12 LAB — TSH+T4F+T3FREE
Free T4: 1.26 ng/dL (ref 0.82–1.77)
T3, Free: 2.8 pg/mL (ref 2.0–4.4)
TSH: 0.442 u[IU]/mL — ABNORMAL LOW (ref 0.450–4.500)

## 2021-05-12 LAB — FSH/LH
FSH: 3.3 m[IU]/mL
LH: 3.3 m[IU]/mL

## 2021-05-12 LAB — HIV ANTIBODY (ROUTINE TESTING W REFLEX): HIV Screen 4th Generation wRfx: NONREACTIVE

## 2021-05-16 ENCOUNTER — Ambulatory Visit: Payer: Self-pay | Attending: Internal Medicine

## 2021-05-16 ENCOUNTER — Other Ambulatory Visit: Payer: Self-pay

## 2021-05-25 ENCOUNTER — Telehealth: Payer: Self-pay | Admitting: Internal Medicine

## 2021-05-25 NOTE — Telephone Encounter (Signed)
Copied from CRM [id number]. Topic: General - Other >> May 23, 2021  1:59 PM Glean Salen wrote: Reason for [id number] called states orange card  was activated for 05/22/21 until 11/16/21. She is asking will she be covered for appt she had on 05/09/21. Please call back

## 2021-05-25 NOTE — Telephone Encounter (Signed)
I return Pt call, she was inform that we still waiting for her application to be review

## 2021-05-31 ENCOUNTER — Telehealth: Payer: Self-pay

## 2021-05-31 NOTE — Telephone Encounter (Signed)
Pt has been scheduled and reminder has been mailed.  

## 2021-05-31 NOTE — Telephone Encounter (Signed)
-----   Message from Marcine Matar, MD sent at 05/09/2021  9:53 PM EDT ----- Give pt f/u in 6 wks from today's date for f/u on weight loss.

## 2021-07-04 ENCOUNTER — Ambulatory Visit: Payer: Self-pay | Attending: Internal Medicine | Admitting: Internal Medicine

## 2021-07-04 ENCOUNTER — Other Ambulatory Visit: Payer: Self-pay

## 2021-07-04 ENCOUNTER — Encounter: Payer: Self-pay | Admitting: Internal Medicine

## 2021-07-04 VITALS — BP 133/79 | HR 74 | Resp 16 | Wt 136.6 lb

## 2021-07-04 DIAGNOSIS — F17200 Nicotine dependence, unspecified, uncomplicated: Secondary | ICD-10-CM

## 2021-07-04 DIAGNOSIS — Z23 Encounter for immunization: Secondary | ICD-10-CM

## 2021-07-04 DIAGNOSIS — Z3009 Encounter for other general counseling and advice on contraception: Secondary | ICD-10-CM

## 2021-07-04 DIAGNOSIS — K029 Dental caries, unspecified: Secondary | ICD-10-CM

## 2021-07-04 NOTE — Progress Notes (Signed)
Patient ID: April Calhoun, female    DOB: 1971-06-22  MRN: [id number]  CC: Follow-up on perimenopausal symptoms  Subjective: April Calhoun is a 50 y.o. female who presents for 2 mth f/u perimenopausal symptoms  Her concerns today include:  IDA, Collagenous colitis (Dr. Myrtie Neither), vit D def  Seen 04/2021 with complaints of irregular menses over the past 2 years and unintentional weight loss after stopping oral budesonide.  She wanted to know whether she can still get pregnant since she still got menstrual cycles though they were not occurring every month.  Recheck FSH and LH levels and they were not in the perimenopausal range.  She was placed on Effexor to help with some of the vasomotor symptoms of being perimenopausal along with anxiety. Took Effexor only 2 days.  Made her feel tired and felt anxiety was worse.  So she discontinued using it.  Feels she does not need anything at this time.  LNMP started yesterday, also had menses in 05/2021  Smokes 5 cigarettes a day.  Had stopped for 10 yrs and started again 2 yrs.  Wants to quit Feels she can quit on her own  Requests referral to the dentist to have dental cavities addressed.  HM: due for flu shot.  Wants to think about shingles vaccine Patient Active Problem List   Diagnosis Date Noted   Iron deficiency anemia 03/10/2020   Atypical glandular cells of undetermined significance (AGUS) on cervical Pap smear 09/22/2013     Current Outpatient Medications on File Prior to Visit  Medication Sig Dispense Refill   cholecalciferol (VITAMIN D3) 25 MCG (1000 UNIT) tablet Take 1,000 Units by mouth daily as needed. (Patient not taking: Reported on 05/09/2021)     Ferrous Sulfate (IRON) 325 (65 Fe) MG TABS 1 tab PO Q Mon/Wed and Frid (Patient not taking: Reported on 05/09/2021) 100 tablet 0   venlafaxine XR (EFFEXOR XR) 37.5 MG 24 hr capsule Take 1 capsule (37.5 mg total) by mouth daily with breakfast. 30 capsule 1   No current  facility-administered medications on file prior to visit.    No Known Allergies  Social History   Socioeconomic History   Marital status: Significant Other    Spouse name: Not on file   Number of children: 2   Years of education: Not on file   Highest education level: 11th grade  Occupational History   Occupation: Programmer, applications  Tobacco Use   Smoking status: Every Day    Packs/day: 0.25    Years: 9.00    Pack years: 2.25    Types: Cigarettes   Smokeless tobacco: Never   Tobacco comments:    2 cigs per day  Vaping Use   Vaping Use: Never used  Substance and Sexual Activity   Alcohol use: Yes    Comment: social rare alcohol    Drug use: No   Sexual activity: Yes    Birth control/protection: Condom  Other Topics Concern   Not on file  Social History Narrative   Not on file   Social Determinants of Health   Financial Resource Strain: Not on file  Food Insecurity: Not on file  Transportation Needs: Not on file  Physical Activity: Not on file  Stress: Not on file  Social Connections: Not on file  Intimate Partner Violence: Not on file    Family History  Problem Relation Age of Onset   Diabetes Maternal Grandmother    Diabetes Paternal Grandmother    Hyperlipidemia  Mother    Hyperlipidemia Sister    Colon cancer Paternal Aunt    Colon polyps Neg Hx    Esophageal cancer Neg Hx    Rectal cancer Neg Hx    Stomach cancer Neg Hx     Past Surgical History:  Procedure Laterality Date   COLONOSCOPY     ESOPHAGOGASTRODUODENOSCOPY      ROS: Review of Systems Negative except as stated above  PHYSICAL EXAM: BP 133/79   Pulse 74   Resp 16   Wt 136 lb 9.6 oz (62 kg)   SpO2 100%   BMI 21.39 kg/m   Wt Readings from Last 3 Encounters:  07/04/21 136 lb 9.6 oz (62 kg)  05/09/21 138 lb 6.4 oz (62.8 kg)  01/03/21 154 lb (69.9 kg)    Physical Exam  General appearance - alert, well appearing, and in no distress Mental status - normal mood, behavior, speech,  dress, motor activity, and thought processes   CMP Latest Ref Rng & Units 05/11/2021 12/01/2020 11/19/2019  Glucose 65 - 99 mg/dL 91 85 88  BUN 6 - 24 mg/dL 15 13 16   Creatinine 0.57 - 1.00 mg/dL ) 4.82(L 0.78  Sodium 134 - 144 mmol/L 139 142 143  Potassium 3.5 - 5.2 mmol/L 4.0 4.4 4.2  Chloride 96 - 106 mmol/L 107(H) 105 106  CO2 20 - 29 mmol/L 20 23 23   Calcium 8.7 - 10.2 mg/dL 6.75) 9.3 9.1  Total Protein 6.0 - 8.5 g/dL 5.9(L) 6.5 6.5  Total Bilirubin 0.0 - 1.2 mg/dL 0.5 0.5 0.5  Alkaline Phos 44 - 121 IU/L 69 75 69  AST 0 - 40 IU/L 15 20 22   ALT 0 - 32 IU/L 13 19 23    Lipid Panel     Component Value Date/Time   CHOL 161 12/01/2020 1030   TRIG 67 12/01/2020 1030   HDL 58 12/01/2020 1030   CHOLHDL 2.8 12/01/2020 1030   LDLCALC 90 12/01/2020 1030    CBC    Component Value Date/Time   WBC 6.1 05/11/2021 1049   WBC 5.7 10/25/2018 1451   RBC 4.43 05/11/2021 1049   RBC 4.49 10/25/2018 1451   HGB 13.3 05/11/2021 1049   HCT 40.8 05/11/2021 1049   PLT 185 05/11/2021 1049   MCV 92 05/11/2021 1049   MCH 30.0 05/11/2021 1049   MCH 28.1 10/25/2018 1451   MCHC 32.6 05/11/2021 1049   MCHC 32.1 10/25/2018 1451   RDW 13.9 05/11/2021 1049   LYMPHSABS 1.6 12/01/2020 1030   EOSABS 0.2 12/01/2020 1030   BASOSABS 0.0 12/01/2020 1030    ASSESSMENT AND PLAN: 1. Family planning Inform patient that her hormone levels are not in the perimenopausal range as yet.  Since she still gets menstrual cycle there is a risk that she can get pregnant even at the age of 45.  I recommend considering getting back on Depo-Provera shot for 1 to 2 years.  However patient declines stating that she and her partner decided to continue using condoms instead.  2. Tobacco dependence Advised to quit.  Patient wanting to quit.  She declines medication to help her quit.  States that she will wean herself down and quit like that.  We will reassess progress on subsequent visit.  3. Dental cavity - Ambulatory  referral to Dentistry  4. Need for immunization against influenza - Flu Vaccine QUAD 81mo+IM (Fluarix, Fluzone & Alfiuria Quad PF)     Patient was given the opportunity to ask questions.  Patient  verbalized understanding of the plan and was able to repeat key elements of the plan.   Orders Placed This Encounter  Procedures   Flu Vaccine QUAD 19mo+IM (Fluarix, Fluzone & Alfiuria Quad PF)     Requested Prescriptions    No prescriptions requested or ordered in this encounter    No follow-ups on file.  Jonah Blue, MD, FACP

## 2021-08-14 IMAGING — CT CT ABD-PELV W/ CM
2 of 5 series · 16 of 46 positions shown, 18 images · IV contrast (omnipaque)
Comparison: None.

CLINICAL DATA: Generalized abdominal pain and diarrhea for 6
months. Unintentional weight loss.

EXAM:
CT ABDOMEN AND PELVIS WITH CONTRAST
TECHNIQUE: Multidetector CT imaging of the abdomen and pelvis was performed
using the standard protocol following bolus administration of
intravenous contrast.
CONTRAST:  100mL OMNIPAQUE IOHEXOL 300 MG/ML  SOLN

[Series 2: axial st · axial · 0.72mm/px · z∈[-480,-55]mm · 13 of 99 slices shown, 15 images]
[im 7/99  soft-tissue]
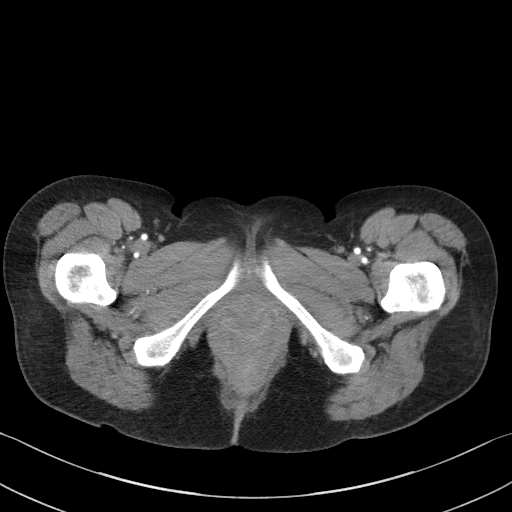
[im 7/99  bone]
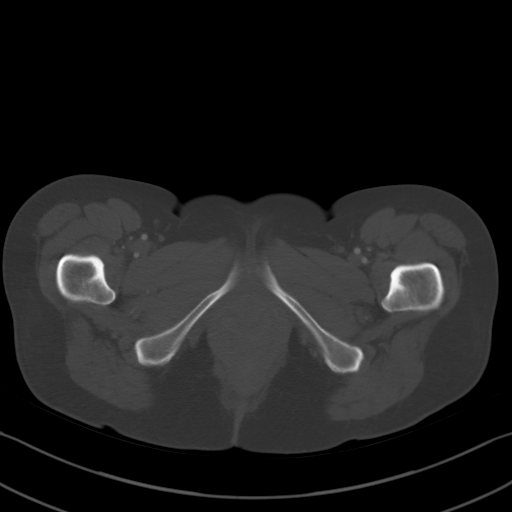
[im 14/99  soft-tissue]
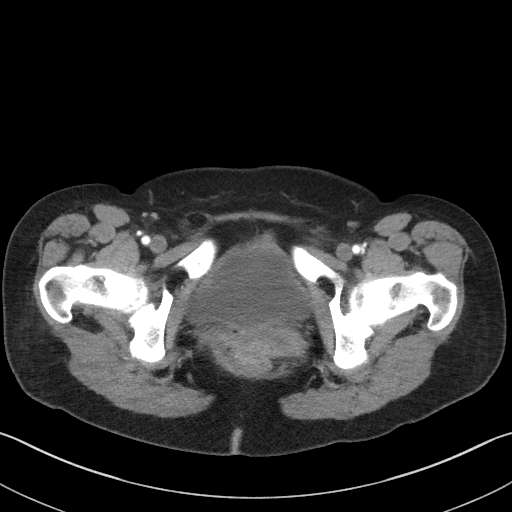
[im 20/99  soft-tissue]
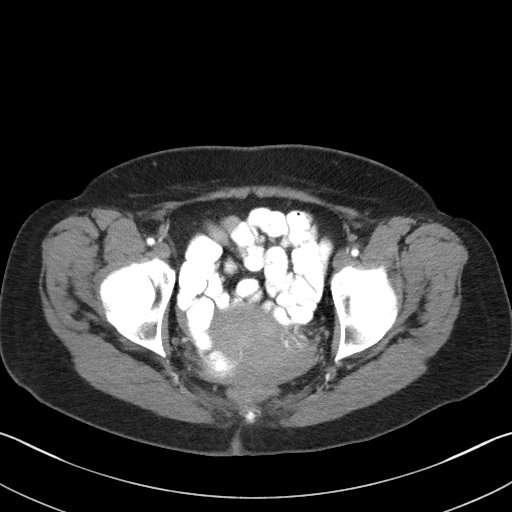
[im 27/99  soft-tissue]
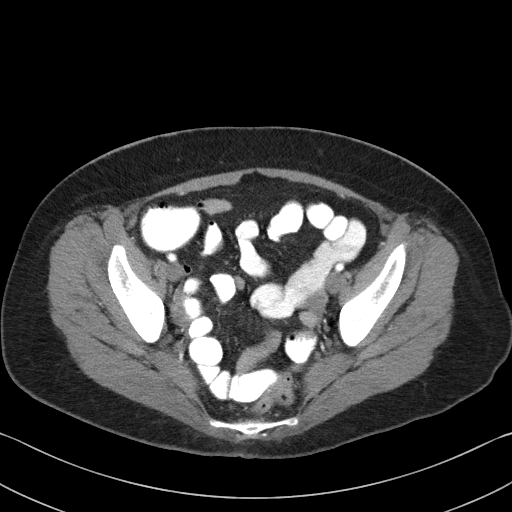
[im 33/99  soft-tissue]
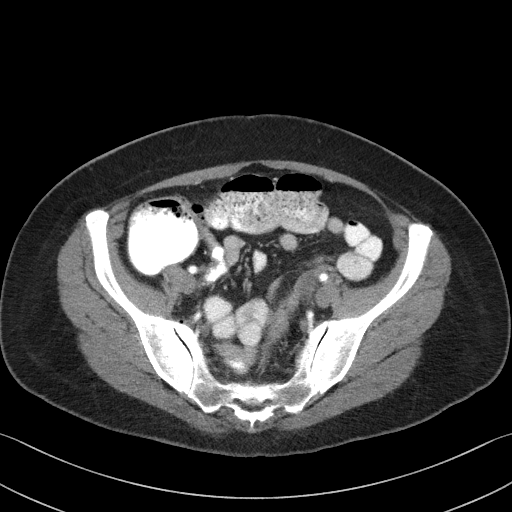
[im 40/99  soft-tissue]
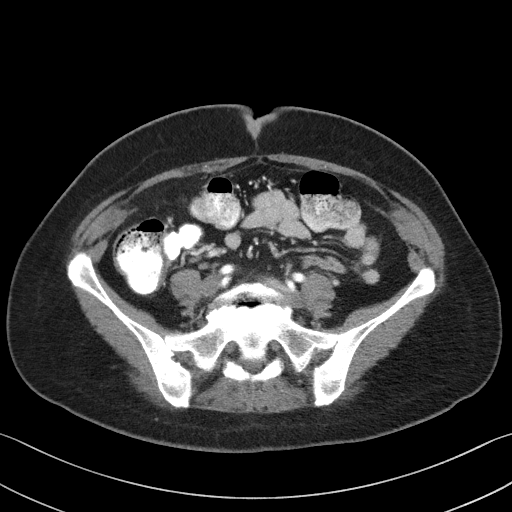
[im 53/99  soft-tissue]
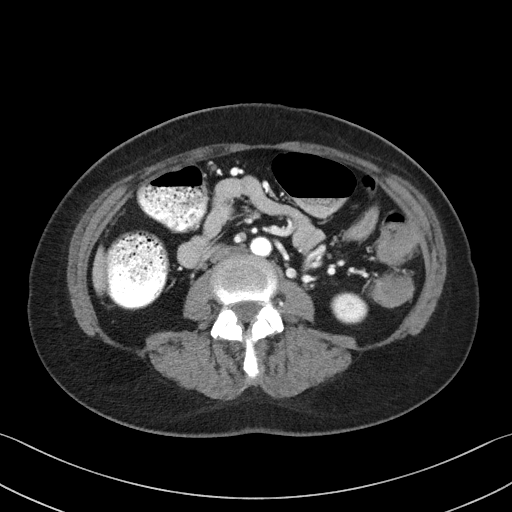
[im 59/99  soft-tissue]
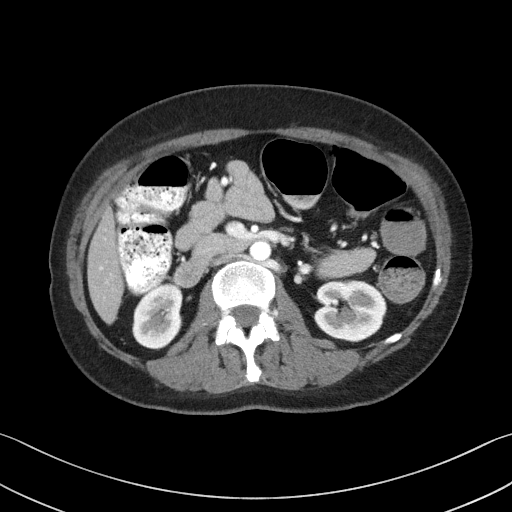
[im 66/99  soft-tissue]
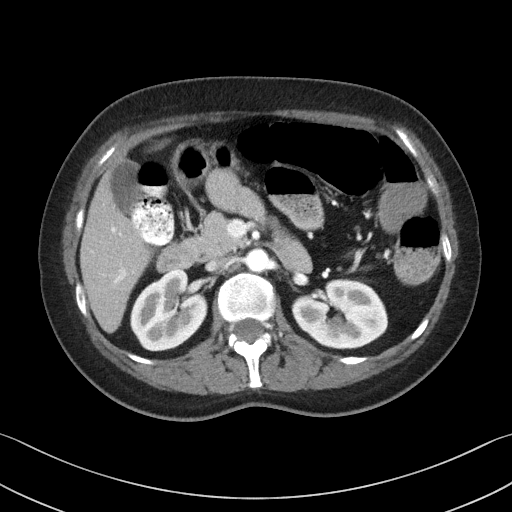
[im 66/99  bone]
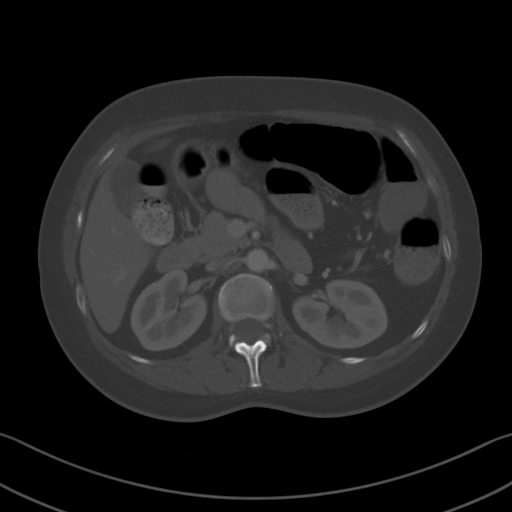
[im 72/99  soft-tissue]
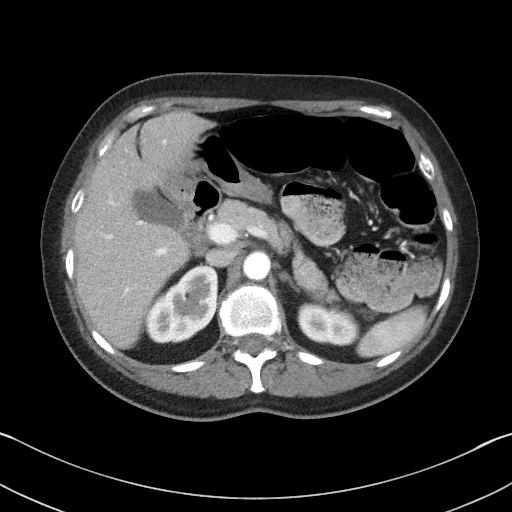
[im 79/99  soft-tissue]
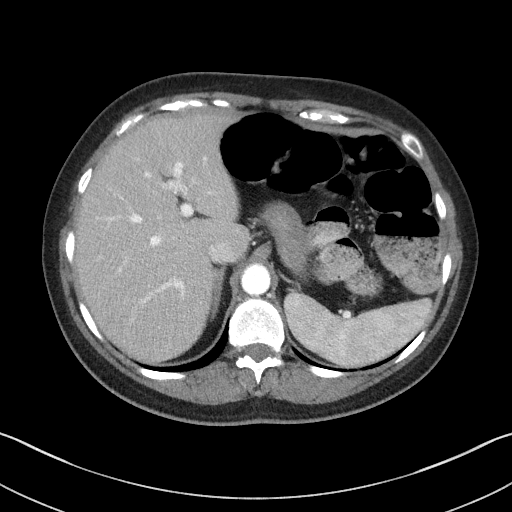
[im 85/99  soft-tissue]
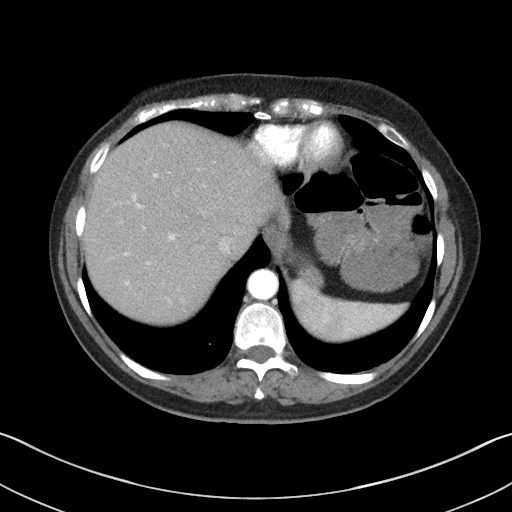
[im 92/99  soft-tissue]
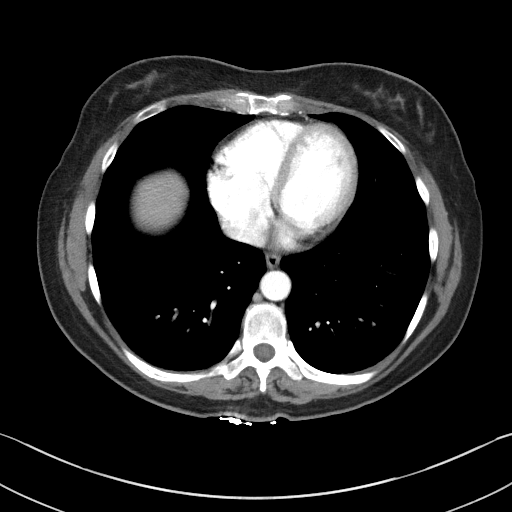

[Series 4: coronal st · coronal · 0.71mm/px · 3 of 92 slices shown]
[im 31/92  soft-tissue]
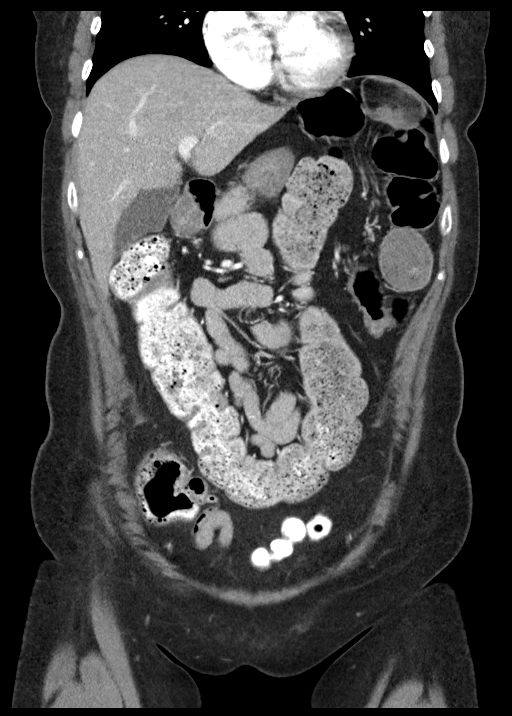
[im 41/92  soft-tissue]
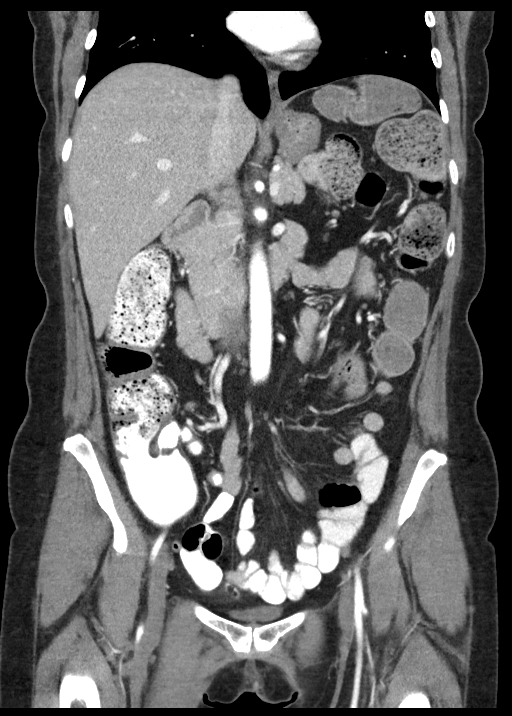
[im 51/92  soft-tissue]
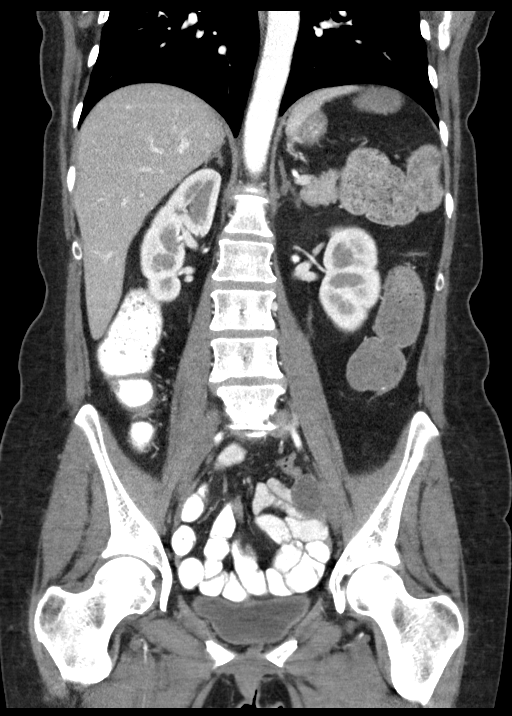

[16 of 46 positions shown; findings below may reference images not displayed]

FINDINGS: Lower Chest: No acute findings.

Hepatobiliary: No hepatic masses identified. Gallbladder is
unremarkable. No evidence of biliary ductal dilatation.

Pancreas:  No mass or inflammatory changes.

Spleen: Within normal limits in size and appearance.

Adrenals/Urinary Tract: No masses identified. No evidence of
ureteral calculi or hydronephrosis.

Stomach/Bowel: No evidence of obstruction, inflammatory process or
abnormal fluid collections. Normal appendix visualized.

Vascular/Lymphatic: No pathologically enlarged lymph nodes. No
abdominal aortic aneurysm.

Reproductive:  No mass or other significant abnormality.

Other:  None.

Musculoskeletal:  No suspicious bone lesions identified.
IMPRESSION: Negative. No acute findings or other significant abnormality.

## 2021-10-04 ENCOUNTER — Other Ambulatory Visit: Payer: Self-pay

## 2021-10-30 ENCOUNTER — Ambulatory Visit: Payer: No Typology Code available for payment source

## 2021-11-17 ENCOUNTER — Ambulatory Visit: Payer: Self-pay | Attending: Internal Medicine

## 2022-10-02 ENCOUNTER — Telehealth: Payer: Self-pay | Admitting: Internal Medicine

## 2022-10-02 NOTE — Telephone Encounter (Signed)
Copied from [place] [phone]. Topic: General - Call Back - No Documentation >> Oct 02, 2022  4:55 PM April Calhoun wrote: Reason for CRM: Pt stated she was returning the call from a missed call that she received

## 2022-11-15 ENCOUNTER — Encounter: Payer: Self-pay | Admitting: Internal Medicine

## 2022-11-15 ENCOUNTER — Other Ambulatory Visit: Payer: Self-pay

## 2022-11-15 ENCOUNTER — Ambulatory Visit: Payer: Self-pay | Attending: Internal Medicine | Admitting: Internal Medicine

## 2022-11-15 ENCOUNTER — Other Ambulatory Visit: Payer: Self-pay | Admitting: Obstetrics and Gynecology

## 2022-11-15 VITALS — BP 109/68 | HR 68 | Temp 98.1°F | Ht 67.0 in | Wt 137.0 lb

## 2022-11-15 DIAGNOSIS — J302 Other seasonal allergic rhinitis: Secondary | ICD-10-CM

## 2022-11-15 DIAGNOSIS — Z1231 Encounter for screening mammogram for malignant neoplasm of breast: Secondary | ICD-10-CM

## 2022-11-15 DIAGNOSIS — F17200 Nicotine dependence, unspecified, uncomplicated: Secondary | ICD-10-CM

## 2022-11-15 DIAGNOSIS — Z23 Encounter for immunization: Secondary | ICD-10-CM

## 2022-11-15 DIAGNOSIS — F17210 Nicotine dependence, cigarettes, uncomplicated: Secondary | ICD-10-CM | POA: Insufficient documentation

## 2022-11-15 DIAGNOSIS — Z716 Tobacco abuse counseling: Secondary | ICD-10-CM | POA: Insufficient documentation

## 2022-11-15 DIAGNOSIS — R4586 Emotional lability: Secondary | ICD-10-CM

## 2022-11-15 DIAGNOSIS — N951 Menopausal and female climacteric states: Secondary | ICD-10-CM

## 2022-11-15 DIAGNOSIS — Z780 Asymptomatic menopausal state: Secondary | ICD-10-CM | POA: Insufficient documentation

## 2022-11-15 DIAGNOSIS — R7989 Other specified abnormal findings of blood chemistry: Secondary | ICD-10-CM

## 2022-11-15 DIAGNOSIS — Z0000 Encounter for general adult medical examination without abnormal findings: Secondary | ICD-10-CM

## 2022-11-15 DIAGNOSIS — F419 Anxiety disorder, unspecified: Secondary | ICD-10-CM | POA: Insufficient documentation

## 2022-11-15 MED ORDER — NICOTINE 14 MG/24HR TD PT24
14.0000 mg | MEDICATED_PATCH | Freq: Every day | TRANSDERMAL | 0 refills | Status: DC
  Filled 2022-11-15: qty 28, 28d supply, fill #0

## 2022-11-15 MED ORDER — FLUTICASONE PROPIONATE 50 MCG/ACT NA SUSP
1.0000 | Freq: Every day | NASAL | 1 refills | Status: DC | PRN
  Filled 2022-11-15: qty 16, 60d supply, fill #0

## 2022-11-15 MED ORDER — ZOSTER VAC RECOMB ADJUVANTED 50 MCG/0.5ML IM SUSR: 0.5000 mL | Freq: Once | INTRAMUSCULAR | 0 refills | Status: AC

## 2022-11-15 NOTE — Progress Notes (Signed)
Patient ID: April Calhoun, female    DOB: [birth year]  MRN: [id number]  CC: Annual Exam (Physical. Kinnie Feil flonase & blood work for thyroid. /Reports having no menstrual period in 1 yr, fatigue, off balance when bending down. /Patient interested in gaining weight. Velta Addison to flu vax. )   Subjective: April Calhoun is a 52 y.o. female who presents for physical.  Her boyfriend, Clifton James, is with her.  Last seen 06/2021. Her concerns today include:  IDA, Collagenous colitis (Dr. Loletha Carrow), vit D def    Wanting to gain wgh History of collagenous colitis and was on budesonide in the past.  Lost weight after it was stopped.  However weight has remained stable since I last saw her in 2022.  Bowel movements alternate between diarrhea and constipation.  No blood in the stools. 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  Eats healthy - no fast foods, eats a lot of veggies.  Not much fruits Drinks organic protein shake 3x/wk Very active daily;  cleans houses for a living TSH was a little low on last visit.  She was suppose to come back to have level recheck but never came back Endorses palpitations occasionally at nights Has hot flashes, mood swings and a lot of anxiety all of which she attributes to being menopausal.  Last menstrual cycle was about a year ago.  Takes several supplements over-the-counter including magnesium 200 mg, B complex, Maca 500 mg.  Takes the magnesium to help decrease body cramps.  Takes Maca because she feels it helps decrease mood swings and anxiety.  She is asking about HRT for symptoms.  States that her hot flashes are not too bad and not as bad as when she saw me in 2022.  Denies any vaginal dryness at this time. No problems swallowing foods/liquids.  Thinks she has allergies. Symptoms progressas spring approaches Has nasal congestion, sneezing, itchy throat. Takes Zyrtec every day which is very helpful; uses son Flonase.  Would like prescription of her own for  Flonase  Tob [id number] 3-5 cig/day.  Started at age 54. Quit and restarts off and on.  Last quit 3-53yr ago.  Wanting to quit.  HM: Agreeable to receiving flu vaccine and shingles vaccine. Patient Active Problem List   Diagnosis Date Noted   Iron deficiency anemia 03/10/2020   Atypical glandular cells of undetermined significance (AGUS) on cervical Pap smear 09/22/2013     Current Outpatient Medications on File Prior to Visit  Medication Sig Dispense Refill   cholecalciferol (VITAMIN D3) 25 MCG (1000 UNIT) tablet Take 1,000 Units by mouth daily as needed. (Patient not taking: Reported on 05/09/2021)     Ferrous Sulfate (IRON) 325 (65 Fe) MG TABS 1 tab PO Q Mon/Wed and Frid (Patient not taking: Reported on 05/09/2021) 100 tablet 0   venlafaxine XR (EFFEXOR XR) 37.5 MG 24 hr capsule Take 1 capsule (37.5 mg total) by mouth daily with breakfast. (Patient not taking: Reported on 11/15/2022) 30 capsule 1   No current facility-administered medications on file prior to visit.    No Known Allergies  Social History   Socioeconomic History   Marital status: Significant Other    Spouse name: Not on file   Number of children: 2   Years of education: Not on file   Highest education level: 11th grade  Occupational History   Occupation: HChartered certified accountant Tobacco Use   Smoking status: Every Day    Packs/day: 0.25    Years: 9.00  Total pack years: 2.25    Types: Cigarettes   Smokeless tobacco: Never   Tobacco comments:    2 cigs per day  Vaping Use   Vaping Use: Never used  Substance and Sexual Activity   Alcohol use: Yes    Comment: social rare alcohol    Drug use: No   Sexual activity: Yes    Birth control/protection: Condom  Other Topics Concern   Not on file  Social History Narrative   Not on file   Social Determinants of Health   Financial Resource Strain: Not on file  Food Insecurity: Not on file  Transportation Needs: No Transportation Needs (04/14/2020)   PRAPARE -  Transportation    Lack of Transportation (Medical): No    Lack of Transportation (Non-Medical): No  Physical Activity: Not on file  Stress: Not on file  Social Connections: Not on file  Intimate Partner Violence: Not on file    Family History  Problem Relation Age of Onset   Diabetes Maternal Grandmother    Diabetes Paternal Grandmother    Hyperlipidemia Mother    Hyperlipidemia Sister    Colon cancer Paternal Aunt    Colon polyps Neg Hx    Esophageal cancer Neg Hx    Rectal cancer Neg Hx    Stomach cancer Neg Hx     Past Surgical History:  Procedure Laterality Date   COLONOSCOPY     ESOPHAGOGASTRODUODENOSCOPY      ROS: Review of Systems  Constitutional:  Negative for activity change.  HENT:  Positive for sneezing. Negative for dental problem and sore throat.        Sees dentist regularly for cleaning.  Eyes:        Feels she needs an eye exam.  Wears over-the-counter reading glasses.  No problems with distance vision.  Respiratory:  Negative for cough and shortness of breath.   Cardiovascular:  Negative for chest pain.  Gastrointestinal:  Negative for blood in stool.  Genitourinary:  Negative for difficulty urinating.  Psychiatric/Behavioral:  The patient is nervous/anxious.    Negative except as stated above Wears reading glasses Sees dentist regularly PHYSICAL EXAM: BP 109/68 (BP Location: Left Arm, Patient Position: Sitting, Cuff Size: Normal)   Pulse 68   Temp 98.1 F (36.7 C) (Oral)   Ht 5' 7"$  (1.702 m)   Wt 137 lb (62.1 kg)   SpO2 98%   BMI 21.46 kg/m   Wt Readings from Last 3 Encounters:  11/15/22 137 lb (62.1 kg)  07/04/21 136 lb 9.6 oz (62 kg)  05/09/21 138 lb 6.4 oz (62.8 kg)    Physical Exam  General appearance - alert, well appearing, middle-age Hispanic female and in no distress Mental status - normal mood, behavior, speech, dress, motor activity, and thought processes Eyes - pupils equal and reactive, extraocular eye movements  intact Ears - bilateral TM's and external ear canals normal Nose - normal and patent, no erythema, discharge or polyps Mouth - mucous membranes moist, pharynx normal without lesions Neck - supple, no significant adenopathy Lymphatics - no palpable lymphadenopathy, no hepatosplenomegaly Chest - clear to auscultation, no wheezes, rales or rhonchi, symmetric air entry Heart - normal rate, regular rhythm, normal S1, S2, no murmurs, rubs, clicks or gallops Abdomen - soft, nontender, nondistended, no masses or organomegaly Breasts - breasts appear normal, no suspicious masses, no skin or nipple changes or axillary nodes Pelvic - not due Neurological - cranial nerves II through XII intact, motor and sensory grossly normal bilaterally  Musculoskeletal - Enlargement PIP/DIP jts Extremities - peripheral pulses normal, no pedal edema, no clubbing or cyanosis.  Patient has a lot of spider veins in the lower legs. Skin - normal coloration and turgor, no rashes, no suspicious skin lesions noted     11/15/2022   12:16 PM 07/04/2021    2:50 PM 05/09/2021    4:37 PM 03/10/2020   10:37 AM  GAD 7 : Generalized Anxiety Score  Nervous, Anxious, on Edge 2 2 2 3  $ Control/stop worrying 3 1 1 3  $ Worry too much - different things 3  1 3  $ Trouble relaxing 2 1 1 3  $ Restless 2 0 0 3  Easily annoyed or irritable 1 0 1 3  Afraid - awful might happen 2 0 0 3  Total GAD 7 Score 15  6 21        $ Latest Ref Rng & Units 05/11/2021   10:49 AM 12/01/2020   10:30 AM 11/19/2019   11:49 AM  CMP  Glucose 65 - 99 mg/dL 91  85  88   BUN 6 - 24 mg/dL 15  13  16   $ Creatinine 0.57 - 1.00 mg/dL 0.55  0.71  0.63   Sodium 134 - 144 mmol/L 139  142  143   Potassium 3.5 - 5.2 mmol/L 4.0  4.4  4.2   Chloride 96 - 106 mmol/L 107  105  106   CO2 20 - 29 mmol/L 20  23  23   $ Calcium 8.7 - 10.2 mg/dL 8.6  9.3  9.1   Total Protein 6.0 - 8.5 g/dL 5.9  6.5  6.5   Total Bilirubin 0.0 - 1.2 mg/dL 0.5  0.5  0.5   Alkaline Phos 44 - 121  IU/L 69  75  69   AST 0 - 40 IU/L 15  20  22   $ ALT 0 - 32 IU/L 13  19  23    $ Lipid Panel     Component Value Date/Time   CHOL 161 12/01/2020 1030   TRIG 67 12/01/2020 1030   HDL 58 12/01/2020 1030   CHOLHDL 2.8 12/01/2020 1030   LDLCALC 90 12/01/2020 1030    CBC    Component Value Date/Time   WBC 6.1 05/11/2021 1049   WBC 5.7 10/25/2018 1451   RBC 4.43 05/11/2021 1049   RBC 4.49 10/25/2018 1451   HGB 13.3 05/11/2021 1049   HCT 40.8 05/11/2021 1049   PLT 185 05/11/2021 1049   MCV 92 05/11/2021 1049   MCH 30.0 05/11/2021 1049   MCH 28.1 10/25/2018 1451   MCHC 32.6 05/11/2021 1049   MCHC 32.1 10/25/2018 1451   RDW 13.9 05/11/2021 1049   LYMPHSABS 1.6 12/01/2020 1030   EOSABS 0.2 12/01/2020 1030   BASOSABS 0.0 12/01/2020 1030    ASSESSMENT AND PLAN: 1. Annual physical exam Advised patient that she is not overweight or underweight.  Weight is adequate for height.  Advised against gaining weight.  Encouraged her to continue healthy eating habits and to remain active. - CBC - Comprehensive metabolic panel - Lipid panel  2. Tobacco dependence Pt is current smoker. Patient advised to quit smoking. Discussed health risks associated with smoking including lung and other types of cancers, chronic lung diseases and CV risks.. Pt ready to give trail of quitting.   Discussed methods to help quit including quitting cold [place], use of NRT, Chantix and Bupropion.  Pt wanting to try: Nicotine patches.  I went over with her how  to use the patches.  Based on the amount that she is currently smoking, we will start her on the intermediate dose. _3_ Minutes spent on counseling. F/U: Reassess progress on subsequent visit.  - nicotine (NICODERM CQ - DOSED IN MG/24 HOURS) 14 mg/24hr patch; Place 1 patch (14 mg total) onto the skin daily.  Dispense: 28 patch; Refill: 0  3. Seasonal allergies Continue over-the-counter Zyrtec at bedtime.  We will add Flonase nasal spray to use as needed. -  fluticasone (FLONASE) 50 MCG/ACT nasal spray; Place 1 spray into both nostrils daily as needed for allergies or rhinitis.  Dispense: 16 g; Refill: 1  4. Perimenopausal vasomotor symptoms 5. Mood changes For her most symptomatic issue is the mood swings and anxiety.  We had tried her with Effexor in the past which she did not tolerate.  Discussed trying her with low-dose Zoloft but patient states her sister is on that and it makes her feel tired so she does not want to try that medication.  Advised that if we were to put her on HRT, I would like to have her off cigarettes completely and up-to-date with mammogram. -In the meantime, I told her that we can try her with BuSpar for the anxiety.  Patient declined.  6. Encounter for screening mammogram for malignant neoplasm of breast Given form for mammogram scholarship.  We will send this in for her.  Mammogram ordered.  7. Need for shingles vaccine Prescription given for Shingrix vaccine for her to take to our pharmacy downstairs. - Zoster Vaccine Adjuvanted [facility]) injection; Inject 0.5 mLs into the muscle once for 1 dose.  Dispense: 0.5 mL; Refill: 0  8. Abnormal thyroid blood test - TSH+T4F+T3Free    Patient was given the opportunity to ask questions.  Patient verbalized understanding of the plan and was able to repeat key elements of the plan.   This documentation was completed using Radio producer.  Any transcriptional errors are unintentional.  Orders Placed This Encounter  Procedures   CBC   Comprehensive metabolic panel   Lipid panel   TSH+T4F+T3Free     Requested Prescriptions   Signed Prescriptions Disp Refills   nicotine (NICODERM CQ - DOSED IN MG/24 HOURS) 14 mg/24hr patch 28 patch 0    Sig: Place 1 patch (14 mg total) onto the skin daily.   fluticasone (FLONASE) 50 MCG/ACT nasal spray 16 g 1    Sig: Place 1 spray into both nostrils daily as needed for allergies or rhinitis.   Zoster Vaccine  Adjuvanted [facility]) injection 0.5 mL 0    Sig: Inject 0.5 mLs into the muscle once for 1 dose.    Return in about 6 months (around 05/16/2023).  Karle Plumber, MD, FACP

## 2022-11-15 NOTE — Patient Instructions (Signed)
Cuidados preventivos en las mujeres de 40 a 64 aos de edad Preventive Care 40-53 Years Old, Female Los cuidados preventivos hacen referencia a las opciones en cuanto al estilo de vida y a las visitas al mdico, las cuales pueden promover la salud y el bienestar. Las visitas de cuidado preventivo tambin se denominan exmenes de bienestar. Qu puedo esperar para mi visita de cuidado preventivo? Asesoramiento Su mdico puede preguntarle acerca de: Antecedentes mdicos, incluidos los siguientes: Problemas mdicos pasados. Antecedentes mdicos familiares. Antecedentes de embarazo. Salud actual, incluido lo siguiente: Ciclo menstrual. Mtodos anticonceptivos. Su bienestar emocional. Bienestar en el hogar y las relaciones personales. Actividad sexual y salud sexual. Estilo de vida, incluido lo siguiente: Consumo de alcohol, nicotina, tabaco o drogas. Acceso a armas de fuego. Hbitos de alimentacin, ejercicio y sueo. Su trabajo y ambiente laboral. Uso de pantalla solar. Cuestiones de seguridad, como el uso de cinturn de seguridad y casco de bicicleta. Examen fsico El mdico revisar lo siguiente: Estatura y peso. Estos pueden usarse para calcular el IMC (ndice de masa corporal). El IMC es una medicin que indica si tiene un peso saludable. Circunferencia de la cintura. Es una medicin alrededor de la cintura. Esta medicin tambin indica si tiene un peso saludable y puede ayudar a predecir su riesgo de padecer ciertas enfermedades, como diabetes tipo 2 y presin arterial alta. Frecuencia cardaca y presin arterial. Temperatura corporal. Piel para detectar manchas anormales. Qu vacunas necesito?  Las vacunas se aplican a varias edades, segn un cronograma. El mdico le recomendar vacunas segn su edad, sus antecedentes mdicos, su estilo de vida y otros factores, como los viajes o el lugar donde trabaja. Qu pruebas necesito? Pruebas de deteccin El mdico puede recomendar  pruebas de deteccin de ciertas afecciones. Esto puede incluir: Niveles de lpidos y colesterol. Pruebas de deteccin de la diabetes. Esto se realiza mediante un control del azcar en la sangre (glucosa) despus de no haber comido durante un periodo de tiempo (ayuno). Examen plvico y prueba de Papanicolaou. Prueba de hepatitis B. Prueba de hepatitis C. Prueba del VIH (virus de inmunodeficiencia humana). Pruebas de infecciones de transmisin sexual (ITS), si est en riesgo. Pruebas de deteccin de cncer de pulmn. Pruebas de deteccin de cncer colorrectal. Mamografa. Hable con su mdico sobre cundo debe comenzar a realizarse mamografas de manera regular. Esto depende de si tiene antecedentes familiares de cncer de mama o no. Pruebas de deteccin de cncer relacionado con las mutaciones del BRCA. Es posible que se las deba realizar si tiene antecedentes de cncer de mama, de ovario, de trompas o peritoneal. Densitometra sea. Esto se realiza para detectar osteoporosis. Hable con su mdico sobre los resultados de las pruebas, las opciones de tratamiento y, si corresponde, la necesidad de realizar ms pruebas. Siga estas instrucciones en su casa: Comida y bebida  Siga una dieta que incluya frutas y verduras frescas, cereales integrales, protenas magras y productos lcteos descremados. Tome los suplementos vitamnicos y minerales como se lo haya indicado el mdico. No beba alcohol si: Su mdico le indica no hacerlo. Est embarazada, puede estar embarazada o est tratando de quedar embarazada. Si bebe alcohol: Limite la cantidad que consume de 0 a 1 medida por da. Sepa cunta cantidad de alcohol hay en las bebidas que toma. En los [place], una medida equivale a una botella de cerveza de 12 oz (355 ml), un vaso de vino de 5 oz (148 ml) o un vaso de una bebida alcohlica de alta graduacin de 1 oz (44   ml). Estilo de vida Cepllese los dientes a la maana y a la noche con pasta  dental con fluoruro. Use hilo dental una vez al da. Haga al menos 30 minutos de ejercicio, 5 o ms das cada semana. No consuma ningn producto que contenga nicotina o tabaco. Estos productos incluyen cigarrillos, tabaco para mascar y aparatos de vapeo, como los cigarrillos electrnicos. Si necesita ayuda para dejar de fumar, consulte al mdico. No consuma drogas. Si es sexualmente activa, practique sexo seguro. Use un condn u otra forma de proteccin para prevenir las infecciones de transmisin sexual (ITS). Si no desea quedar embarazada, use un mtodo anticonceptivo. Si busca un embarazo, realice una consulta previa al embarazo con el mdico. Tome aspirina nicamente como se lo haya indicado el mdico. Asegrese de que comprende qu cantidad y cul presentacin debe tomar. Trabaje con el mdico para averiguar si es seguro y beneficioso para usted tomar aspirina a diario. Busque maneras saludables de controlar el estrs, tales como: Meditacin, yoga o escuchar msica. Lleve un diario personal. Hable con una persona confiable. Pase tiempo con amigos y familiares. Minimice la exposicin a la radiacin UV para reducir el riesgo de cncer de piel. Seguridad [place] siempre el cinturn de seguridad al conducir o viajar en un vehculo. No conduzca: Si ha estado bebiendo alcohol. No viaje con un conductor que ha estado bebiendo. Si est cansada o distrada. Mientras est enviando mensajes de texto. Si ha estado usando sustancias o drogas que alteran la funcin mental. Use un casco y otros equipos de proteccin durante las actividades deportivas. Si tiene armas de fuego en su casa, asegrese de seguir todos los procedimientos de seguridad correspondientes. Busque ayuda si fue vctima de abuso fsico o abuso sexual. Cundo volver? Visite al mdico una vez al ao para una visita anual de control de bienestar. Pregntele al mdico con qu frecuencia debe realizarse un control de la vista y los  dientes. Mantenga su esquema de vacunacin al da. Esta informacin no tiene como fin reemplazar el consejo del mdico. Asegrese de hacerle al mdico cualquier pregunta que tenga. Document Revised: 03/30/2021 Document Reviewed: 03/30/2021 Elsevier Patient Education  2023 Elsevier Inc.  

## 2022-11-16 LAB — COMPREHENSIVE METABOLIC PANEL
ALT: 14 IU/L (ref 0–32)
AST: 20 IU/L (ref 0–40)
Albumin/Globulin Ratio: 2.1 (ref 1.2–2.2)
Albumin: 4.2 g/dL (ref 3.8–4.9)
Alkaline Phosphatase: 78 IU/L (ref 44–121)
BUN/Creatinine Ratio: 30 — ABNORMAL HIGH (ref 9–23)
BUN: 16 mg/dL (ref 6–24)
Bilirubin Total: 0.5 mg/dL (ref 0.0–1.2)
CO2: 23 mmol/L (ref 20–29)
Calcium: 9.3 mg/dL (ref 8.7–10.2)
Chloride: 105 mmol/L (ref 96–106)
Creatinine, Ser: 0.54 mg/dL — ABNORMAL LOW (ref 0.57–1.00)
Globulin, Total: 2 g/dL (ref 1.5–4.5)
Glucose: 92 mg/dL (ref 70–99)
Potassium: 4 mmol/L (ref 3.5–5.2)
Sodium: 143 mmol/L (ref 134–144)
Total Protein: 6.2 g/dL (ref 6.0–8.5)
eGFR: 111 mL/min/{1.73_m2} (ref >59)

## 2022-11-16 LAB — CBC
Hematocrit: 37.8 % (ref 34.0–46.6)
Hemoglobin: 11.7 g/dL (ref 11.1–15.9)
MCH: 27 pg (ref 26.6–33.0)
MCHC: 31 g/dL — ABNORMAL LOW (ref 31.5–35.7)
MCV: 87 fL (ref 79–97)
Platelets: 191 10*3/uL (ref 150–450)
RBC: 4.34 x10E6/uL (ref 3.77–5.28)
RDW: 14.9 % (ref 11.7–15.4)
WBC: 5.5 10*3/uL (ref 3.4–10.8)

## 2022-11-16 LAB — LIPID PANEL
Chol/HDL Ratio: 2.4 ratio (ref 0.0–4.4)
Cholesterol, Total: 147 mg/dL (ref 100–199)
HDL: 61 mg/dL (ref >39)
LDL Chol Calc (NIH): 70 mg/dL (ref 0–99)
Triglycerides: 86 mg/dL (ref 0–149)
VLDL Cholesterol Cal: 16 mg/dL (ref 5–40)

## 2022-11-16 LAB — TSH+T4F+T3FREE
Free T4: 1.27 ng/dL (ref 0.82–1.77)
T3, Free: 3.3 pg/mL (ref 2.0–4.4)
TSH: 0.561 u[IU]/mL (ref 0.450–4.500)

## 2022-11-20 ENCOUNTER — Ambulatory Visit: Payer: Self-pay | Attending: Internal Medicine

## 2022-12-13 ENCOUNTER — Ambulatory Visit: Payer: Self-pay | Admitting: Hematology and Oncology

## 2022-12-13 ENCOUNTER — Ambulatory Visit
Admission: RE | Admit: 2022-12-13 | Discharge: 2022-12-13 | Disposition: A | Discharge status: Home / Self Care | Payer: No Typology Code available for payment source | Source: Ambulatory Visit | Attending: Obstetrics and Gynecology | Admitting: Obstetrics and Gynecology

## 2022-12-13 VITALS — BP 111/71

## 2022-12-13 DIAGNOSIS — Z1231 Encounter for screening mammogram for malignant neoplasm of breast: Secondary | ICD-10-CM

## 2022-12-13 NOTE — Patient Instructions (Signed)
[place]quilla about self breast awareness and gave educational materials to take home. Patient did not need a Pap smear today due to last Pap smear was in 2021 per patient. Let her know [facility] will cover Pap smears every 5 years unless has a history of abnormal Pap smears. Referred patient to the [facility] for screening mammogram. Appointment scheduled for 12/13/22. Patient aware of appointment and will be there. Let patient know will follow up with her within the next couple weeks with results. [place] verbalized understanding.  Melodye Ped, NP 10:12 AM

## 2022-12-13 NOTE — Progress Notes (Addendum)
April Calhoun is a 52 y.o. female who presents to [facility] clinic today with no complaints.    Pap Smear: Pap not smear completed today. Last Pap smear was 2021 and was normal. Per patient has no history of an abnormal Pap smear. Last Pap smear result is not available in Epic.   Physical exam: Breasts Breasts symmetrical. No skin abnormalities bilateral breasts. No nipple retraction bilateral breasts. No nipple discharge bilateral breasts. No lymphadenopathy. No lumps palpated bilateral breasts.  MS DIGITAL SCREENING TOMO BILATERAL  Result Date: 04/15/2020 CLINICAL DATA:  Screening. EXAM: DIGITAL SCREENING BILATERAL MAMMOGRAM WITH TOMO AND CAD COMPARISON:  Previous exam(s). ACR Breast Density Category b: There are scattered areas of fibroglandular density. FINDINGS: There are no findings suspicious for malignancy. Images were processed with CAD. IMPRESSION: No mammographic evidence of malignancy. A result letter of this screening mammogram will be mailed directly to the patient. RECOMMENDATION: Screening mammogram in one year. (Code:SM-B-01Y) BI-RADS CATEGORY  1: Negative. Electronically Signed   By: Zerita Boers M.D.   On: 04/15/2020 16:24         Pelvic/Bimanual Pap is not indicated today    Smoking History: Patient is a former smoker and was not referred to quit line.   Patient Navigation: Patient education provided. Access to services provided for patient through [facility] program. No interpreter provided. No transportation provided   Colorectal Cancer Screening: Per patient has had colonoscopy completed on 2021  No complaints today. She is due for follow up in 10 years.   Breast and Cervical Cancer Risk Assessment: Patient does not have family history of breast cancer, known genetic mutations, or radiation treatment to the chest before age 74. Patient does not have history of cervical dysplasia, immunocompromised, or DES exposure in-utero.  Risk Scores as of 12/13/2022      April Calhoun           5-year 0.76 %   Lifetime 6.31 %   This patient is [place]/Lat[place] but has no documented birth country, so the [name] used data from [name] patients to calculate their risk score. Document a birth country in the Demographics activity for a more accurate score.         Last calculated by Claretha Cooper, CMA on 12/13/2022 at  9:33 AM        A: [facility] exam without pap smear No complaints with benign exam.  P: Referred patient to the [facility] for a screening mammogram. Appointment scheduled 12/13/22.  Melodye Ped, NP 12/13/2022 9:46 AM

## 2023-05-16 ENCOUNTER — Encounter: Payer: Self-pay | Admitting: Internal Medicine

## 2023-05-16 ENCOUNTER — Ambulatory Visit: Payer: Self-pay | Attending: Internal Medicine | Admitting: Internal Medicine

## 2023-05-16 ENCOUNTER — Other Ambulatory Visit: Payer: Self-pay

## 2023-05-16 VITALS — BP 103/68 | HR 70 | Temp 98.4°F | Ht 67.0 in | Wt 139.0 lb

## 2023-05-16 DIAGNOSIS — M130 Polyarthritis, unspecified: Secondary | ICD-10-CM

## 2023-05-16 DIAGNOSIS — M67441 Ganglion, right hand: Secondary | ICD-10-CM

## 2023-05-16 DIAGNOSIS — R5383 Other fatigue: Secondary | ICD-10-CM

## 2023-05-16 MED ORDER — DICLOFENAC SODIUM 1 % EX GEL
2.0000 g | Freq: Four times a day (QID) | CUTANEOUS | 2 refills | Status: DC
  Filled 2023-05-16: qty 100, 13d supply, fill #0
  Filled 2023-06-02: qty 100, 13d supply, fill #1
  Filled 2023-10-01: qty 100, 13d supply, fill #2

## 2023-05-16 NOTE — Progress Notes (Signed)
Patient ID: April Calhoun, female    DOB: 03-Apr-1971  MRN: [id number]  CC: Follow-up (Follow-up. /Pain in joints & bones X2 mo/Yes to pap for another appt )   Subjective: April Calhoun is a 52 y.o. female who presents for routine f/u visit.  Boyfriend is with her. Her concerns today include:  IDA, Collagenous colitis (Dr. Myrtie Neither), vit D def   Pt does not need interpreter but we had 1 on standby which we used a few times with [employer] Marylee FlorasDemetrius Charity #[id number] .  C/o feeling tired and body pain; started 2 mths ago and has become worse Pain in elbow, knees, hands and feet Some mornings she wakes up with body aches.  Goes away once she gets moving but more noticeable after she gets off work.  Does house keeping.  No swelling in joints Some stiffness in hands.  No fever.  Feels bones are weak.  Has noticed a soft swelling on the inner aspect of the right ring finger.  It has been there for months and is slowly increased in size.  Not painful. Takes Tylenol and ASA for pain.  Takes ASA and Tylenol 2 x a wk Sleeping 5-6 hrs at nights.  At home 5-6 p.m. Does a little wgh training and use of rubber bans after work 3-4 days a wk.  To help build muscle.  She walks dog Watches TV.  Turns TV off once she gets in bed 12-1 a.m.  Wakes for work around 6 a.m.  Does not snore unless very tired  Patient Active Problem List   Diagnosis Date Noted   Iron deficiency anemia 03/10/2020   Atypical glandular cells of undetermined significance (AGUS) on cervical Pap smear 09/22/2013     Current Outpatient Medications on File Prior to Visit  Medication Sig Dispense Refill   cholecalciferol (VITAMIN D3) 25 MCG (1000 UNIT) tablet Take 1,000 Units by mouth daily as needed.     fluticasone (FLONASE) 50 MCG/ACT nasal spray Place 1 spray into both nostrils daily as needed for allergies or rhinitis. 16 g 1   Maca Root 500 MG CAPS Take by mouth.     nicotine (NICODERM CQ - DOSED IN  MG/24 HOURS) 14 mg/24hr patch Place 1 patch (14 mg total) onto the skin daily. 28 patch 0   vitamin B-12 (CYANOCOBALAMIN) 100 MCG tablet Take 100 mcg by mouth daily.     Ferrous Sulfate (IRON) 325 (65 Fe) MG TABS 1 tab PO Q Mon/Wed and Frid (Patient not taking: Reported on 05/09/2021) 100 tablet 0   venlafaxine XR (EFFEXOR XR) 37.5 MG 24 hr capsule Take 1 capsule (37.5 mg total) by mouth daily with breakfast. (Patient not taking: Reported on 11/15/2022) 30 capsule 1   No current facility-administered medications on file prior to visit.    No Known Allergies  Social History   Socioeconomic History   Marital status: Significant Other    Spouse name: Not on file   Number of children: 2   Years of education: Not on file   Highest education level: 11th grade  Occupational History   Occupation: Programmer, applications  Tobacco Use   Smoking status: Every Day    Current packs/day: 0.25    Average packs/day: 0.3 packs/day for 34.0 years (8.5 ttl pk-yrs)    Types: Cigarettes   Smokeless tobacco: Never   Tobacco comments:    2 cigs per day  Vaping Use   Vaping status: Never Used  Substance  and Sexual Activity   Alcohol use: Yes    Comment: social rare alcohol    Drug use: No   Sexual activity: Yes    Birth control/protection: Condom  Other Topics Concern   Not on file  Social History Narrative   Not on file   Social Determinants of Health   Financial Resource Strain: Not on file  Food Insecurity: No Food Insecurity (12/13/2022)   Hunger Vital Sign    Worried About Running Out of Food in the Last Year: Never true    Ran Out of Food in the Last Year: Never true  Transportation Needs: No Transportation Needs (12/13/2022)   PRAPARE - Administrator, Civil Service (Medical): No    Lack of Transportation (Non-Medical): No  Physical Activity: Not on file  Stress: Not on file  Social Connections: Not on file  Intimate Partner Violence: Not on file    Family History  Problem  Relation Age of Onset   Hyperlipidemia Mother    Hyperlipidemia Sister    Colon cancer Paternal Aunt    Diabetes Maternal Grandmother    Diabetes Paternal Grandmother    Colon polyps Neg Hx    Esophageal cancer Neg Hx    Rectal cancer Neg Hx    Stomach cancer Neg Hx    Breast cancer Neg Hx     Past Surgical History:  Procedure Laterality Date   COLONOSCOPY     ESOPHAGOGASTRODUODENOSCOPY      ROS: Review of Systems Negative except as stated above  PHYSICAL EXAM: BP 103/68 (BP Location: Left Arm, Patient Position: Sitting, Cuff Size: Normal)   Pulse 70   Temp 98.4 F (36.9 C) (Oral)   Ht 5\' 7"  (1.702 m)   Wt 139 lb (63 kg)   SpO2 98%   BMI 21.77 kg/m   Physical Exam  General appearance - alert, well appearing, and in no distress Mental status - normal mood, behavior, speech, dress, motor activity, and thought processes Musculoskeletal -hands: Enlargement of the PIP and DIP joints.  No signs of active inflammation.  Soft 1 1/2 to 2 cm movable cyst on the medial aspect of the right ring finger.  Good range of motion of the hand joints.  Good range of motion of both wrists and elbows without signs of active inflammation.  Knees: Mild enlargement of both joints.  No point tenderness.  Good range of motion.  Ankle joints do not appear inflamed and are nontender to touch.  Good range of motion. Extremities - peripheral pulses normal, no pedal edema, no clubbing or cyanosis      Latest Ref Rng & Units 11/15/2022   12:15 PM 05/11/2021   10:49 AM 12/01/2020   10:30 AM  CMP  Glucose 70 - 99 mg/dL 92  91  85   BUN 6 - 24 mg/dL 16  15  13    Creatinine 0.57 - 1.00 mg/dL 1.30  8.65  7.84   Sodium 134 - 144 mmol/L 143  139  142   Potassium 3.5 - 5.2 mmol/L 4.0  4.0  4.4   Chloride 96 - 106 mmol/L 105  107  105   CO2 20 - 29 mmol/L 23  20  23    Calcium 8.7 - 10.2 mg/dL 9.3  8.6  9.3   Total Protein 6.0 - 8.5 g/dL 6.2  5.9  6.5   Total Bilirubin 0.0 - 1.2 mg/dL 0.5  0.5  0.5    Alkaline Phos 44 - 121  IU/L 78  69  75   AST 0 - 40 IU/L 20  15  20    ALT 0 - 32 IU/L 14  13  19     Lipid Panel     Component Value Date/Time   CHOL 147 11/15/2022 1215   TRIG 86 11/15/2022 1215   HDL 61 11/15/2022 1215   CHOLHDL 2.4 11/15/2022 1215   LDLCALC 70 11/15/2022 1215    CBC    Component Value Date/Time   WBC 5.5 11/15/2022 1215   WBC 5.7 10/25/2018 1451   RBC 4.34 11/15/2022 1215   RBC 4.49 10/25/2018 1451   HGB 11.7 11/15/2022 1215   HCT 37.8 11/15/2022 1215   PLT 191 11/15/2022 1215   MCV 87 11/15/2022 1215   MCH 27.0 11/15/2022 1215   MCH 28.1 10/25/2018 1451   MCHC 31.0 (L) 11/15/2022 1215   MCHC 32.1 10/25/2018 1451   RDW 14.9 11/15/2022 1215   LYMPHSABS 1.6 12/01/2020 1030   EOSABS 0.2 12/01/2020 1030   BASOSABS 0.0 12/01/2020 1030    ASSESSMENT AND PLAN: 1. Polyarthritis Examination of the hands seem more consistent with osteoarthritis.  However we will also screen for rheumatoid arthritis.  Recommend using Tylenol over-the-counter and Voltaren gel. - Rheumatoid factor - CYCLIC CITRUL PEPTIDE ANTIBODY, IGG/IGA - Sedimentation Rate - diclofenac Sodium (VOLTAREN) 1 % GEL; Apply 2 g topically 4 (four) times daily.  Dispense: 100 g; Refill: 2  2. Other fatigue Encouraged her to try to get at least 7 to 8 hours of sleep at night. History does not suggest sleep apnea. CBC on last visit showed hemoglobin low normal range.  We will recheck that today. - CBC  3. Ganglion cyst of finger of right hand Advised that if this is bothersome to her we can refer to orthopedics to have it removed.  Patient wanted to hold off at this time.     Patient was given the opportunity to ask questions.  Patient verbalized understanding of the plan and was able to repeat key elements of the plan.   This documentation was completed using Paediatric nurse.  Any transcriptional errors are unintentional.  No orders of the defined types were placed in  this encounter.    Requested Prescriptions    No prescriptions requested or ordered in this encounter    No follow-ups on file.  Jonah Blue, MD, FACP

## 2023-05-18 LAB — CBC
Hematocrit: 37.5 % (ref 34.0–46.6)
Hemoglobin: 11.8 g/dL (ref 11.1–15.9)
MCH: 26 pg — ABNORMAL LOW (ref 26.6–33.0)
MCHC: 31.5 g/dL (ref 31.5–35.7)
MCV: 83 fL (ref 79–97)
Platelets: 211 10*3/uL (ref 150–450)
RBC: 4.54 x10E6/uL (ref 3.77–5.28)
RDW: 15.3 % (ref 11.7–15.4)
WBC: 6.7 10*3/uL (ref 3.4–10.8)

## 2023-05-18 LAB — CYCLIC CITRUL PEPTIDE ANTIBODY, IGG/IGA: Cyclic Citrullin Peptide Ab: 5 U (ref 0–19)

## 2023-05-18 LAB — SEDIMENTATION RATE: Sed Rate: 5 mm/h (ref 0–40)

## 2023-05-18 LAB — RHEUMATOID FACTOR: Rheumatoid fact SerPl-aCnc: 11 [IU]/mL (ref <14.0)

## 2023-06-03 ENCOUNTER — Other Ambulatory Visit: Payer: Self-pay

## 2023-06-27 ENCOUNTER — Encounter: Payer: Self-pay | Admitting: Internal Medicine

## 2023-06-27 ENCOUNTER — Other Ambulatory Visit: Payer: Self-pay

## 2023-06-27 ENCOUNTER — Ambulatory Visit: Payer: Self-pay | Attending: Internal Medicine | Admitting: Internal Medicine

## 2023-06-27 ENCOUNTER — Other Ambulatory Visit (HOSPITAL_COMMUNITY)
Admission: RE | Admit: 2023-06-27 | Discharge: 2023-06-27 | Disposition: A | Discharge status: Home / Self Care | Payer: Self-pay | Source: Ambulatory Visit | Attending: Internal Medicine | Admitting: Internal Medicine

## 2023-06-27 VITALS — BP 107/66 | HR 62 | Ht 67.0 in | Wt 146.0 lb

## 2023-06-27 DIAGNOSIS — R140 Abdominal distension (gaseous): Secondary | ICD-10-CM

## 2023-06-27 DIAGNOSIS — J302 Other seasonal allergic rhinitis: Secondary | ICD-10-CM

## 2023-06-27 DIAGNOSIS — Z124 Encounter for screening for malignant neoplasm of cervix: Secondary | ICD-10-CM

## 2023-06-27 DIAGNOSIS — N898 Other specified noninflammatory disorders of vagina: Secondary | ICD-10-CM

## 2023-06-27 DIAGNOSIS — R102 Pelvic and perineal pain: Secondary | ICD-10-CM

## 2023-06-27 DIAGNOSIS — K529 Noninfective gastroenteritis and colitis, unspecified: Secondary | ICD-10-CM

## 2023-06-27 MED ORDER — FLUCONAZOLE 150 MG PO TABS
150.0000 mg | ORAL_TABLET | Freq: Every day | ORAL | 0 refills | Status: AC
Start: 2023-06-27 — End: ?
  Filled 2023-06-27: qty 1, 1d supply, fill #0

## 2023-06-27 MED ORDER — FLUTICASONE PROPIONATE 50 MCG/ACT NA SUSP
1.0000 | Freq: Every day | NASAL | 1 refills | Status: DC | PRN
  Filled 2023-06-27: qty 16, 60d supply, fill #0

## 2023-06-27 NOTE — Progress Notes (Signed)
Patient ID: April Calhoun, female    DOB: [birth year]  MRN: [id number]  CC: Gynecologic Exam (Pap. Nicki Reaper intermittent pain in ovaries & uterus X2 mo /No to flu vax. )   Subjective: April Calhoun is a 52 y.o. female who presents for PAP. Her concerns today include:  IDA, Collagenous colitis (Dr. Myrtie Neither), vit D def   GYN History:  Pt is G3P2  Any hx of abn paps?: once Menses regular or irregular?: menses stopped 10/2021 How long does menses last? na Menstrual flow light or heavy?:  Method of birth control?:  na Any vaginal dischg at this time?:  yes white dischg with itching.  Has been having lower abdominal/pelvic pain that she describes as cramps on both sides of lower abdomen like she is about to get menses.  Occurs about 3 days a wk for past 2 mths. Also also gets this pain about 1-2 days post sex. Dysuria?: no Any hx of STI?: no Sexually active with how many partners: 1 female partner Desires STI screen: yes Last MMG: 11/2022.  Tenderness of nipples Family hx of uterine, cervical or breast cancer?:  paternal aunt with ovarian CA  Also c/o intermittent bloating in abdomen after meals; no particular types of foods. Some nausea at times.  Endorses chronic diarrhea intermittently; loose. No blood in stools. Does not associate with any particular foods but does not drink milk due to increase bloating/gaseous/diarrhea.  Using Pepto Bismol and Omeprazole PRN for GERD.  Hx of Collagenous colitis with chronic diarrhea.  Had seen Dr. Myrtie Neither and was on Budesonide for a period.  Had EGD and c-scope in 2021  Patient Active Problem List   Diagnosis Date Noted   Iron deficiency anemia 03/10/2020   Atypical glandular cells of undetermined significance (AGUS) on cervical Pap smear 09/22/2013     Current Outpatient Medications on File Prior to Visit  Medication Sig Dispense Refill   cholecalciferol (VITAMIN D3) 25 MCG (1000 UNIT) tablet Take 1,000 Units by mouth daily as  needed.     diclofenac Sodium (VOLTAREN) 1 % GEL Apply 2 g topically 4 (four) times daily. 100 g 2   Ferrous Sulfate (IRON) 325 (65 Fe) MG TABS 1 tab PO Q Mon/Wed and Frid 100 tablet 0   [place]a Root 500 MG CAPS Take by mouth.     vitamin B-12 (CYANOCOBALAMIN) 100 MCG tablet Take 100 mcg by mouth daily.     nicotine (NICODERM CQ - DOSED IN MG/24 HOURS) 14 mg/24hr patch Place 1 patch (14 mg total) onto the skin daily. (Patient not taking: Reported on 06/27/2023) 28 patch 0   venlafaxine XR (EFFEXOR XR) 37.5 MG 24 hr capsule Take 1 capsule (37.5 mg total) by mouth daily with breakfast. (Patient not taking: Reported on 11/15/2022) 30 capsule 1   No current facility-administered medications on file prior to visit.    No Known Allergies  Social History   Socioeconomic History   Marital status: Significant Other    Spouse name: Not on file   Number of children: 2   Years of education: Not on file   Highest education level: 11th grade  Occupational History   Occupation: Programmer, applications  Tobacco Use   Smoking status: Every Day    Current packs/day: 0.25    Average packs/day: 0.3 packs/day for 34.0 years (8.5 ttl pk-yrs)    Types: Cigarettes   Smokeless tobacco: Never   Tobacco comments:    2 cigs per day  Vaping Use  Vaping status: Never Used  Substance and Sexual Activity   Alcohol use: Yes    Comment: social rare alcohol    Drug use: No   Sexual activity: Yes    Birth control/protection: Condom  Other Topics Concern   Not on file  Social History Narrative   Not on file   Social Determinants of Health   Financial Resource Strain: Not on file  Food Insecurity: No Food Insecurity (12/13/2022)   Hunger Vital Sign    Worried About Running Out of Food in the Last Year: Never true    Ran Out of Food in the Last Year: Never true  Transportation Needs: No Transportation Needs (12/13/2022)   PRAPARE - Administrator, Civil Service (Medical): No    Lack of Transportation  (Non-Medical): No  Physical Activity: Not on file  Stress: Not on file  Social Connections: Not on file  Intimate Partner Violence: Not on file    Family History  Problem Relation Age of Onset   Hyperlipidemia Mother    Hyperlipidemia Sister    Colon cancer Paternal Aunt    Diabetes Maternal Grandmother    Diabetes Paternal Grandmother    Colon polyps Neg Hx    Esophageal cancer Neg Hx    Rectal cancer Neg Hx    Stomach cancer Neg Hx    Breast cancer Neg Hx     Past Surgical History:  Procedure Laterality Date   COLONOSCOPY     ESOPHAGOGASTRODUODENOSCOPY      ROS: Review of Systems Negative except as stated above  PHYSICAL EXAM: BP 107/66 (BP Location: Left Arm, Patient Position: Sitting, Cuff Size: Normal)   Pulse 62   Ht 5\' 7"  (1.702 m)   Wt 146 lb (66.2 kg)   SpO2 100%   BMI 22.87 kg/m   Wt Readings from Last 3 Encounters:  06/27/23 146 lb (66.2 kg)  05/16/23 139 lb (63 kg)  11/15/22 137 lb (62.1 kg)    Physical Exam  General appearance - alert, well appearing, and in no distress Mental status - normal mood, behavior, speech, dress, motor activity, and thought processes Abdomen - soft, nontender, nondistended, no masses or organomegaly Pelvic - CMA Clarisa present:  normal external genitalia, vulva, vagina, cervix, uterus and adnexa.  White discharge noted around the cervix.      Latest Ref Rng & Units 11/15/2022   12:15 PM 05/11/2021   10:49 AM 12/01/2020   10:30 AM  CMP  Glucose 70 - 99 mg/dL 92  91  85   BUN 6 - 24 mg/dL 16  15  13    Creatinine 0.57 - 1.00 mg/dL 4.78  2.95  6.21   Sodium 134 - 144 mmol/L 143  139  142   Potassium 3.5 - 5.2 mmol/L 4.0  4.0  4.4   Chloride 96 - 106 mmol/L 105  107  105   CO2 20 - 29 mmol/L 23  20  23    Calcium 8.7 - 10.2 mg/dL 9.3  8.6  9.3   Total Protein 6.0 - 8.5 g/dL 6.2  5.9  6.5   Total Bilirubin 0.0 - 1.2 mg/dL 0.5  0.5  0.5   Alkaline Phos 44 - 121 IU/L 78  69  75   AST 0 - 40 IU/L 20  15  20    ALT 0 -  32 IU/L 14  13  19     Lipid Panel     Component Value Date/Time   CHOL 147  11/15/2022 1215   TRIG 86 11/15/2022 1215   HDL 61 11/15/2022 1215   CHOLHDL 2.4 11/15/2022 1215   LDLCALC 70 11/15/2022 1215    CBC    Component Value Date/Time   WBC 6.7 05/16/2023 1131   WBC 5.7 10/25/2018 1451   RBC 4.54 05/16/2023 1131   RBC 4.49 10/25/2018 1451   HGB 11.8 05/16/2023 1131   HCT 37.5 05/16/2023 1131   PLT 211 05/16/2023 1131   MCV 83 05/16/2023 1131   MCH 26.0 (L) 05/16/2023 1131   MCH 28.1 10/25/2018 1451   MCHC 31.5 05/16/2023 1131   MCHC 32.1 10/25/2018 1451   RDW 15.3 05/16/2023 1131   LYMPHSABS 1.6 12/01/2020 1030   EOSABS 0.2 12/01/2020 1030   BASOSABS 0.0 12/01/2020 1030    ASSESSMENT AND PLAN:  Assessment and Plan   1. Pap smear for cervical cancer screening - Cytology - PAP - Cervicovaginal ancillary only  2. Vagina itching Will treat empirically for yeast. - fluconazole (DIFLUCAN) 150 MG tablet; Take 1 tablet (150 mg total) by mouth daily.  Dispense: 1 tablet; Refill: 0  3. Pelvic pain - US Pelvic Complete With Transvaginal; Future  4. Abdominal bloating 5. Chronic diarrhea Will screen for H. pylori and celiac.  Avoid dairy.  Try over-the-counter Imodium.  Will get her back in with Dr. Myrtie Neither given history of collagenous colitis. - Ambulatory referral to Gastroenterology - H.pylori screen, POC - Tissue Transglutaminase, IGA  6. Seasonal allergies Requested RF on FLonase - fluticasone (FLONASE) 50 MCG/ACT nasal spray; Place 1 spray into both nostrils daily as needed for allergies or rhinitis.  Dispense: 16 g; Refill: 1  Patient was given the opportunity to ask questions.  Patient verbalized understanding of the plan and was able to repeat key elements of the plan.   This documentation was completed using Paediatric nurse.  Any transcriptional errors are unintentional.  Orders Placed This Encounter  Procedures   US Pelvic Complete  With Transvaginal   Tissue Transglutaminase, IGA   Ambulatory referral to Gastroenterology     Requested Prescriptions   Signed Prescriptions Disp Refills   fluticasone (FLONASE) 50 MCG/ACT nasal spray 16 g 1    Sig: Place 1 spray into both nostrils daily as needed for allergies or rhinitis.   fluconazole (DIFLUCAN) 150 MG tablet 1 tablet 0    Sig: Take 1 tablet (150 mg total) by mouth daily.    Return in about 6 months (around 12/26/2023).  Jonah Blue, MD, FACP

## 2023-06-30 LAB — TISSUE TRANSGLUTAMINASE, IGA: Transglutaminase IgA: <2 U/mL (ref 0–3)

## 2023-06-30 LAB — H. PYLORI BREATH TEST

## 2023-06-30 LAB — SPECIMEN STATUS REPORT

## 2023-07-01 LAB — CERVICOVAGINAL ANCILLARY ONLY
Bacterial Vaginitis (gardnerella): NEGATIVE
Candida Glabrata: NEGATIVE
Candida Vaginitis: POSITIVE — AB
Chlamydia: NEGATIVE
Comment: NEGATIVE
Comment: NEGATIVE
Comment: NEGATIVE
Comment: NEGATIVE
Comment: NEGATIVE
Comment: NORMAL
Neisseria Gonorrhea: NEGATIVE
Trichomonas: NEGATIVE

## 2023-07-01 LAB — CYTOLOGY - PAP
Comment: NEGATIVE
Diagnosis: NEGATIVE
High risk HPV: NEGATIVE

## 2023-07-04 ENCOUNTER — Ambulatory Visit (HOSPITAL_COMMUNITY)
Admission: RE | Admit: 2023-07-04 | Discharge: 2023-07-04 | Disposition: A | Discharge status: Home / Self Care | Payer: Self-pay | Source: Ambulatory Visit | Attending: Internal Medicine | Admitting: Internal Medicine

## 2023-07-04 DIAGNOSIS — R102 Pelvic and perineal pain: Secondary | ICD-10-CM | POA: Insufficient documentation

## 2023-10-01 ENCOUNTER — Other Ambulatory Visit: Payer: Self-pay

## 2023-10-08 ENCOUNTER — Encounter: Payer: Self-pay | Admitting: Gastroenterology

## 2023-10-08 ENCOUNTER — Ambulatory Visit: Payer: Self-pay | Admitting: Gastroenterology

## 2023-10-08 VITALS — BP 120/80 | HR 71 | Ht 67.0 in | Wt 148.0 lb

## 2023-10-08 DIAGNOSIS — K529 Noninfective gastroenteritis and colitis, unspecified: Secondary | ICD-10-CM

## 2023-10-08 DIAGNOSIS — R140 Abdominal distension (gaseous): Secondary | ICD-10-CM

## 2023-10-08 DIAGNOSIS — K52831 Collagenous colitis: Secondary | ICD-10-CM

## 2023-10-08 NOTE — Progress Notes (Signed)
 [facility] Consult Note:  History: April Calhoun 10/08/2023  Referring provider: Vicci Barnie NOVAK, MD  Reason for consult/chief complaint: Diarrhea (Pt is still experiencing intermittent diarrhea after eating) and Bloated (Pt still has bloating after some foods)   Subjective  Prior history: 2021 onset of upper abdominal discomfort that came on sometime after Covid infection as well as concerns related to family history of H. pylori.  She also had chronic diarrhea.  H. pylori serum antibody negative, EGD and colonoscopy performed 08/05/2020.  EGD normal.  Colonoscopy to terminal ileum normal except for small internal hemorrhoids, but biopsy showed collagenous colitis.  Patient was started on budesonide  9 mg daily   Negative TTG Ab Oct 2024  Last seen in this office April 2022, at which time she had recurrence of bloating and loose stool one weaning down budesonide  a couple months prior, so she was increased back to 6 mg once daily.  Plans were to try another wean and follow-up in several months, but no follow-up after that until today.  _____________________   April Calhoun was accompanied by her husband today and a female Spanish interpreter Nadia). She reports that after her last visit with me in 2021, her abdominal bloating and diarrhea all subsided to the point that she does not recall having experienced them for at least a couple of years.  She recalls being under significant stress at the time and questions whether that may have contributed to some of the symptoms.  She had recurrence of bloating sense of gassiness and diarrhea sometime last year and was also struggling with her mood and sleep related to perimenopausal state.  She tried some different supplements and felt significantly improved on [place].  This reportedly relieved many of her perimenopausal symptoms including anxiety and also made the diarrhea stopped.  At this point she is having  regular bowel movements without abdominal cramps as described in 2021, but is still having bloating after meals and is not sure if there may be certain food triggers.  Denies rectal bleeding or dysphagia.  Appetite good and weight stable.   ROS:  Review of Systems  Constitutional:  Negative for appetite change and unexpected weight change.  HENT:  Negative for mouth sores and voice change.   Eyes:  Negative for pain and redness.  Respiratory:  Negative for cough and shortness of breath.   Cardiovascular:  Negative for chest pain and palpitations.  Genitourinary:  Negative for dysuria and hematuria.  Musculoskeletal:  Negative for arthralgias and myalgias.  Skin:  Negative for pallor and rash.  Neurological:  Negative for weakness and headaches.  Hematological:  Negative for adenopathy.     Past Medical History: Past Medical History:  Diagnosis Date   Anemia    Anxiety    GERD (gastroesophageal reflux disease)    Microscopic colitis    Vaginal Pap smear, abnormal      Past Surgical History: Past Surgical History:  Procedure Laterality Date   COLONOSCOPY     ESOPHAGOGASTRODUODENOSCOPY       Family History: Family History  Problem Relation Age of Onset   Hyperlipidemia Mother    Hyperlipidemia Sister    Colon cancer Paternal Aunt    Diabetes Maternal Grandmother    Diabetes Paternal Grandmother    Colon polyps Neg Hx    Esophageal cancer Neg Hx    Rectal cancer Neg Hx    Stomach cancer Neg Hx    Breast cancer Neg Hx  Social History: Social History   Socioeconomic History   Marital status: Significant Other    Spouse name: Not on file   Number of children: 2   Years of education: Not on file   Highest education level: 11th grade  Occupational History   Occupation: Programmer, applications  Tobacco Use   Smoking status: Every Day    Current packs/day: 0.25    Average packs/day: 0.3 packs/day for 34.0 years (8.5 ttl pk-yrs)    Types: Cigarettes   Smokeless  tobacco: Never   Tobacco comments:    2 cigs per day  Vaping Use   Vaping status: Never Used  Substance and Sexual Activity   Alcohol use: Yes    Comment: social rare alcohol    Drug use: No   Sexual activity: Yes    Birth control/protection: Condom  Other Topics Concern   Not on file  Social History Narrative   Not on file   Social Drivers of Health   Financial Resource Strain: Not on file  Food Insecurity: No Food Insecurity (12/13/2022)   Hunger Vital Sign    Worried About Running Out of Food in the Last Year: Never true    Ran Out of Food in the Last Year: Never true  Transportation Needs: No Transportation Needs (12/13/2022)   PRAPARE - Administrator, Civil Service (Medical): No    Lack of Transportation (Non-Medical): No  Physical Activity: Not on file  Stress: Not on file  Social Connections: Not on file    Allergies: No Known Allergies  Outpatient Meds: Current Outpatient Medications  Medication Sig Dispense Refill   cholecalciferol (VITAMIN D3) 25 MCG (1000 UNIT) tablet Take 1,000 Units by mouth daily as needed.     diclofenac  Sodium (VOLTAREN ) 1 % GEL Apply 2 g topically 4 (four) times daily. 100 g 2   Ferrous Sulfate  (IRON ) 325 (65 Fe) MG TABS 1 tab PO Q Mon/Wed and Frid 100 tablet 0   fluconazole  (DIFLUCAN ) 150 MG tablet Take 1 tablet (150 mg total) by mouth daily. 1 tablet 0   fluticasone  (FLONASE ) 50 MCG/ACT nasal spray Place 1 spray into both nostrils daily as needed for allergies or rhinitis. 16 g 1   Maca Root 500 MG CAPS Take by mouth.     nicotine  (NICODERM CQ  - DOSED IN MG/24 HOURS) 14 mg/24hr patch Place 1 patch (14 mg total) onto the skin daily. 28 patch 0   venlafaxine  XR (EFFEXOR  XR) 37.5 MG 24 hr capsule Take 1 capsule (37.5 mg total) by mouth daily with breakfast. 30 capsule 1   vitamin B-12 (CYANOCOBALAMIN) 100 MCG tablet Take 100 mcg by mouth daily.     No current facility-administered medications for this visit.       ___________________________________________________________________ Objective   Exam:  BP 120/80 (BP Location: Left Arm, Patient Position: Sitting, Cuff Size: Normal)   Pulse 71   Ht 5' 7 (1.702 m)   Wt 148 lb (67.1 kg)   SpO2 97%   BMI 23.18 kg/m  Wt Readings from Last 3 Encounters:  10/08/23 148 lb (67.1 kg)  06/27/23 146 lb (66.2 kg)  05/16/23 139 lb (63 kg)    General: Well-appearing Eyes: sclera anicteric, no redness ENT: oral mucosa moist without lesions, no cervical or supraclavicular lymphadenopathy CV: Regular without appreciable murmur, no JVD, no peripheral edema Resp: clear to auscultation bilaterally, normal RR and effort noted GI: soft, no tenderness, with active bowel sounds. No guarding or palpable organomegaly noted.  No bruit Skin; warm and dry, no rash or jaundice noted Neuro: awake, alert and oriented x 3. Normal gross motor function and fluent speech   Labs:  Neg H pylori UBT Oct 2024 Negative TTG IgA antibody October 2024   Encounter Diagnoses  Name Primary?   Abdominal bloating Yes   Chronic diarrhea    Collagenous colitis   Previous symptoms that seem largely due to collagenous colitis but with some possible functional overlay and/or maldigestion.  At this point, since she has had significant clinical improvement on a [place] supplement that has helped her perimenopausal symptoms, and is not currently experiencing diarrhea, then I think she has not likely had recurrence of the collagenous colitis.  There is probably some element of maldigestion (tested negative for celiac), perhaps a functional component remains.    Plan:  Gave her some samples of IBgard to try 2 of them every morning.  Capsule form of essential oils with peppermint and/or and ginger could be tried as well. No current plans for endoscopic procedures.  Some written dietary advice regarding common causes of maldigestion was given.  Return as needed  Thank  you for the courtesy of this consult.  Please call me with any questions or concerns.  Victory LITTIE Brand III  CC: Referring provider noted above

## 2023-10-08 NOTE — Patient Instructions (Addendum)
 We have given you samples of the following medication to take: ibgard  _______________________________________________________  Food Guidelines for those with chronic digestive trouble:  Many people have difficulty digesting certain foods, causing a variety of distressing and embarrassing symptoms such as abdominal pain, bloating and gas.  These foods may need to be avoided or consumed in small amounts.  Here are some tips that might be helpful for you.  1.   Lactose intolerance is the difficulty or complete inability to digest lactose, the natural sugar in milk and anything made from milk.  This condition is harmless, common, and can begin any time during life.  Some people can digest a modest amount of lactose while others cannot tolerate any.  Also, not all dairy products contain equal amounts of lactose.  For example, hard cheeses such as parmesan have less lactose than soft cheeses such as cheddar.  Yogurt has less lactose than milk or cheese.  Many packaged foods (even many brands of bread) have milk, so read ingredient lists carefully.  It is difficult to test for lactose intolerance, so just try avoiding lactose as much as possible for a week and see what happens with your symptoms.  If you seem to be lactose intolerant, the best plan is to avoid it (but make sure you get calcium from another source).  The next best thing is to use lactase enzyme supplements, available over the counter everywhere.  Just know that many lactose intolerant people need to take several tablets with each serving of dairy to avoid symptoms.  Lastly, a lot of restaurant food is made with milk or butter.  Many are things you might not suspect, such as mashed potatoes, rice and pasta (cooked with butter) and grilled items.  If you are lactose intolerant, it never hurts to ask your server what has milk or butter.  2.   Fiber is an important part of your diet, but not all fiber is well-tolerated.  Insoluble fiber such as  bran is often consumed by normal gut bacteria and converted into gas.  Soluble fiber such as oats, squash, carrots and green beans are typically tolerated better.  3.   Some types of carbohydrates can be poorly digested.  Examples include: fructose (apples, cherries, pears, raisins and other dried fruits), fructans (onions, zucchini, large amounts of wheat), sorbitol/mannitol/xylitol and sucralose/Splenda (common artificial sweeteners), and raffinose (lentils, broccoli, cabbage, asparagus, brussel sprouts, many types of beans).  Do a programmer, multimedia for [employer] and you will find helpful information. Beano, a dietary supplement, will often help with raffinose-containing foods.  As with lactase tablets, you may need several per serving.  4.   Whenever possible, avoid processed food&meats and chemical additives.  High fructose corn syrup, a common sweetener, may be difficult to digest.  Eggs and soy (comes from the soybean, and added to many foods now) are other common bloating/gassy foods.  5.  Regarding gluten:  gluten is a protein mainly found in wheat, but also rye and barley.  There is a condition called celiac sprue, which is an inflammatory reaction in the small intestine causing a variety of digestive symptoms.  Blood testing is highly reliable to look for this condition, and sometimes upper endoscopy with small bowel biopsies may be necessary to make the diagnosis.  Many patients who test negative for celiac sprue report improvement in their digestive symptoms when they switch to a gluten-free diet.  However, in these non-celiac gluten sensitive patients, the true role of gluten in  their symptoms is unclear.  Reducing carbohydrates in general may decrease the gas and bloating caused when gut bacteria consume carbs. Also, some of these patients may actually be intolerant of the baker's yeast in bread products rather than the gluten.  Flatbread and other reduced yeast breads might therefore be  tolerated.  There is no specific testing available for most food intolerances, which are discovered mainly by dietary elimination.  Please do not embark on a gluten free diet unless directed by your doctor, as it is highly restrictive, and may lead to nutritional deficiencies if not carefully monitored.  Lastly, beware of internet claims offering personalized tests for food intolerances.  Such testing has no reliable scientific evidence to support its reliability and correlation to symptoms.    6.  The best advice is old advice, especially for those with chronic digestive trouble - try to eat clean.  Balanced diet, avoid processed food, plenty of fruits and vegetables, cut down the sugar, minimal alcohol, avoid tobacco. Make time to care for yourself, get enough sleep, exercise when you can, reduce stress.  Your guts will thank you for it.   - Dr. Victory Legrand [facility]  ____________________________________________________________

## 2023-12-26 ENCOUNTER — Ambulatory Visit: Payer: Self-pay | Admitting: Internal Medicine

## 2024-01-07 ENCOUNTER — Encounter: Payer: Self-pay | Admitting: Internal Medicine

## 2024-01-07 ENCOUNTER — Ambulatory Visit: Payer: Self-pay | Attending: Internal Medicine | Admitting: Internal Medicine

## 2024-01-07 ENCOUNTER — Other Ambulatory Visit: Payer: Self-pay

## 2024-01-07 VITALS — BP 120/82 | HR 71 | Temp 97.7°F | Ht 67.0 in | Wt 151.0 lb

## 2024-01-07 DIAGNOSIS — M130 Polyarthritis, unspecified: Secondary | ICD-10-CM

## 2024-01-07 DIAGNOSIS — R42 Dizziness and giddiness: Secondary | ICD-10-CM

## 2024-01-07 DIAGNOSIS — F320 Major depressive disorder, single episode, mild: Secondary | ICD-10-CM

## 2024-01-07 DIAGNOSIS — R519 Headache, unspecified: Secondary | ICD-10-CM

## 2024-01-07 DIAGNOSIS — D649 Anemia, unspecified: Secondary | ICD-10-CM

## 2024-01-07 DIAGNOSIS — J302 Other seasonal allergic rhinitis: Secondary | ICD-10-CM

## 2024-01-07 MED ORDER — FLUTICASONE PROPIONATE 50 MCG/ACT NA SUSP
1.0000 | Freq: Every day | NASAL | 1 refills | Status: DC | PRN
  Filled 2024-01-07: qty 16, 60d supply, fill #0
  Filled 2024-03-09 – 2024-03-10 (×2): qty 16, 60d supply, fill #1

## 2024-01-07 MED ORDER — AMITRIPTYLINE HCL 10 MG PO TABS
10.0000 mg | ORAL_TABLET | Freq: Every day | ORAL | 1 refills | Status: AC
  Filled 2024-01-07: qty 30, 30d supply, fill #0
  Filled 2024-05-20 (×2): qty 30, 30d supply, fill #1

## 2024-01-07 MED ORDER — DICLOFENAC SODIUM 1 % EX GEL
2.0000 g | Freq: Four times a day (QID) | CUTANEOUS | 2 refills | Status: DC
  Filled 2024-01-07: qty 100, 13d supply, fill #0
  Filled 2024-03-09: qty 100, 13d supply, fill #1
  Filled 2024-04-14: qty 100, 13d supply, fill #2

## 2024-01-07 NOTE — Progress Notes (Signed)
 Patient ID: April Calhoun, female    DOB: 08-02-1971  MRN: [id number]  CC: Follow-up (Follow-up. /Lightheadedness, poor balance X3 weeks /)   Subjective: April Calhoun is a 53 y.o. female who presents for chronic ds management. Her concerns today include:  IDA, Collagenous colitis (Dr. Myrtie Neither), vit D def   AMN Language interpreter used during this encounter. #[id number]   Discussed the use of AI scribe software for clinical note transcription with the patient, who gave verbal consent to proceed.  History of Present Illness   Ms. [name], a patient with no significant past medical history, presents with a three-week history of intermittent lightheadedness, poor balance, and forgetfulness. The lightheadedness is described as severe enough to require holding onto walls for support and is sometimes associated with changes in head position. However, it can also occur without any movement. The episodes of lightheadedness last about a minute and are not associated with changes in position while lying in bed. The patient also reports poor near vision which she attributes to needing glasses, but has not yet sought an eye examination. Feels vision is getting worse. She endorses headaches that occur sometimes with the dizziness.  Headache is located at the back of the head, sometimes the neck and also frontal.  Denies any nausea or vomiting with the headache in the last week, headache has occurred about 4 times.  She takes aspirin with good results.   The patient also reports forgetfulness, such as misplacing keys and forgetting the purpose of going into a room in her house.  The patient admits to not drinking adequate fluids during the day, mainly consuming coffee and occasionally soda. She denies any family history of dementia.     Noted to have positive depression screen.  She reports feeling a little depressed because she is mainly at home these days.  No suicidal  ideation.  Patient Active Problem List   Diagnosis Date Noted   Iron deficiency anemia 03/10/2020   Atypical glandular cells of undetermined significance (AGUS) on cervical Pap smear 09/22/2013     Current Outpatient Medications on File Prior to Visit  Medication Sig Dispense Refill   cholecalciferol (VITAMIN D3) 25 MCG (1000 UNIT) tablet Take 1,000 Units by mouth daily as needed.     Ferrous Sulfate (IRON) 325 (65 Fe) MG TABS 1 tab PO Q Mon/Wed and Frid 100 tablet 0   fluconazole (DIFLUCAN) 150 MG tablet Take 1 tablet (150 mg total) by mouth daily. 1 tablet 0   Maca Root 500 MG CAPS Take by mouth.     vitamin B-12 (CYANOCOBALAMIN) 100 MCG tablet Take 100 mcg by mouth daily.     No current facility-administered medications on file prior to visit.    No Known Allergies  Social History   Socioeconomic History   Marital status: Significant Other    Spouse name: Not on file   Number of children: 2   Years of education: Not on file   Highest education level: 11th grade  Occupational History   Occupation: Programmer, applications  Tobacco Use   Smoking status: Every Day    Current packs/day: 0.25    Average packs/day: 0.3 packs/day for 34.0 years (8.5 ttl pk-yrs)    Types: Cigarettes   Smokeless tobacco: Never   Tobacco comments:    2 cigs per day  Vaping Use   Vaping status: Never Used  Substance and Sexual Activity   Alcohol use: Yes    Comment: social rare  alcohol    Drug use: No   Sexual activity: Yes    Birth control/protection: Condom  Other Topics Concern   Not on file  Social History Narrative   Not on file   Social Drivers of Health   Financial Resource Strain: Medium Risk (01/07/2024)   Overall Financial Resource Strain (CARDIA)    Difficulty of Paying Living Expenses: Somewhat hard  Food Insecurity: Food Insecurity Present (01/07/2024)   Hunger Vital Sign    Worried About Running Out of Food in the Last Year: Sometimes true    Ran Out of Food in the Last Year:  Sometimes true  Transportation Needs: No Transportation Needs (12/13/2022)   PRAPARE - Administrator, Civil Service (Medical): No    Lack of Transportation (Non-Medical): No  Physical Activity: Sufficiently Active (01/07/2024)   Exercise Vital Sign    Days of Exercise per Week: 3 days    Minutes of Exercise per Session: 60 min  Stress: Stress Concern Present (01/07/2024)   [employer] of Occupational Health - Occupational Stress Questionnaire    Feeling of Stress : To some extent  Social Connections: Moderately Integrated (01/07/2024)   Social Connection and Isolation Panel [NHANES]    Frequency of Communication with Friends and Family: More than three times a week    Frequency of Social Gatherings with Friends and Family: Once a week    Attends Religious Services: More than 4 times per year    Active Member of [employer] or Organizations: No    Attends Banker Meetings: Never    Marital Status: Living with partner  Intimate Partner Violence: Not At Risk (01/07/2024)   Humiliation, Afraid, Rape, and Kick questionnaire    Fear of Current or Ex-Partner: No    Emotionally Abused: No    Physically Abused: No    Sexually Abused: No    Family History  Problem Relation Age of Onset   Hyperlipidemia Mother    Hyperlipidemia Sister    Colon cancer Paternal Aunt    Diabetes Maternal Grandmother    Diabetes Paternal Grandmother    Colon polyps Neg Hx    Esophageal cancer Neg Hx    Rectal cancer Neg Hx    Stomach cancer Neg Hx    Breast cancer Neg Hx     Past Surgical History:  Procedure Laterality Date   COLONOSCOPY     ESOPHAGOGASTRODUODENOSCOPY      ROS: Review of Systems Negative except as stated above  PHYSICAL EXAM: BP 120/82 (BP Location: Left Arm, Patient Position: Standing)   Pulse 71   Temp 97.7 F (36.5 C) (Oral)   Ht 5\' 7"  (1.702 m)   Wt 151 lb (68.5 kg)   SpO2 99%   BMI 23.65 kg/m   Wt Readings from Last 3 Encounters:  01/07/24 151  lb (68.5 kg)  10/08/23 148 lb (67.1 kg)  06/27/23 146 lb (66.2 kg)    Physical Exam  General appearance - alert, well appearing, and in no distress Mental status - normal mood, behavior, speech, dress, motor activity, and thought processes Eyes -Pink conjunctiva Mouth - mucous membranes moist, pharynx normal without lesions Neck - supple, no significant adenopathy Chest - clear to auscultation, no wheezes, rales or rhonchi, symmetric air entry Heart - normal rate, regular rhythm, normal S1, S2, no murmurs, rubs, clicks or gallops Neurological - cranial nerves II through XII intact, motor and sensory grossly normal bilaterally, Romberg sign negative, normal gait and station  01/07/2024   10:21 AM  MMSE - Mini Mental State Exam  Orientation to time 5  Orientation to Place 5  Registration 3  Attention/ Calculation 4  Recall 3  Language- name 2 objects 2  Language- repeat 1  Language- follow 3 step command 3  Language- read & follow direction 1  Write a sentence 1  Copy design 1  Total score 29      01/07/2024    9:15 AM 05/16/2023   10:28 AM 11/15/2022   10:43 AM  Depression screen PHQ 2/9  Decreased Interest 3 2 1   Down, Depressed, Hopeless 2 2 1   PHQ - 2 Score 5 4 2   Altered sleeping 2 1 1   Tired, decreased energy 3 3 2   Change in appetite 1 1 2   Feeling bad or failure about yourself  1 2 1   Trouble concentrating 2 1 0  Moving slowly or fidgety/restless 0 0 0  Suicidal thoughts 0 0 0  PHQ-9 Score 14 12 8   Difficult doing work/chores Somewhat difficult         Latest Ref Rng & Units 11/15/2022   12:15 PM 05/11/2021   10:49 AM 12/01/2020   10:30 AM  CMP  Glucose 70 - 99 mg/dL 92  91  85   BUN 6 - 24 mg/dL 16  15  13    Creatinine 0.57 - 1.00 mg/dL 1.47  8.29  5.62   Sodium 134 - 144 mmol/L 143  139  142   Potassium 3.5 - 5.2 mmol/L 4.0  4.0  4.4   Chloride 96 - 106 mmol/L 105  107  105   CO2 20 - 29 mmol/L 23  20  23    Calcium 8.7 - 10.2 mg/dL 9.3  8.6  9.3    Total Protein 6.0 - 8.5 g/dL 6.2  5.9  6.5   Total Bilirubin 0.0 - 1.2 mg/dL 0.5  0.5  0.5   Alkaline Phos 44 - 121 IU/L 78  69  75   AST 0 - 40 IU/L 20  15  20    ALT 0 - 32 IU/L 14  13  19     Lipid Panel     Component Value Date/Time   CHOL 147 11/15/2022 1215   TRIG 86 11/15/2022 1215   HDL 61 11/15/2022 1215   CHOLHDL 2.4 11/15/2022 1215   LDLCALC 70 11/15/2022 1215    CBC    Component Value Date/Time   WBC 6.7 05/16/2023 1131   WBC 5.7 10/25/2018 1451   RBC 4.54 05/16/2023 1131   RBC 4.49 10/25/2018 1451   HGB 11.8 05/16/2023 1131   HCT 37.5 05/16/2023 1131   PLT 211 05/16/2023 1131   MCV 83 05/16/2023 1131   MCH 26.0 (L) 05/16/2023 1131   MCH 28.1 10/25/2018 1451   MCHC 31.5 05/16/2023 1131   MCHC 32.1 10/25/2018 1451   RDW 15.3 05/16/2023 1131   LYMPHSABS 1.6 12/01/2020 1030   EOSABS 0.2 12/01/2020 1030   BASOSABS 0.0 12/01/2020 1030    ASSESSMENT AND PLAN: 1. Dizziness (Primary) Patient presenting with 3 weeks history of intermittent dizziness associated with episodic headache.  Given that she is over the age of 38 presenting with new onset headache, we will proceed with imaging study of the head.  Check some baseline blood tests including CBC.  Advised to drink at least 48 glasses of water daily to stay hydrated.  Orthostatic BP/pulse negative. - CBC - Comprehensive metabolic panel with GFR - CT HEAD  WO CONTRAST ( ); Future  2. Nonintractable episodic headache, unspecified headache type See #1 above. Encouraged her to get an eye exam done as soon as possible.  She is uninsured. Will try with low-dose of amitriptyline for headache prophylaxis and to help with depression.  Advised patient to take the medicine at night as it can cause drowsiness. - CT HEAD WO CONTRAST ( ); Future - amitriptyline (ELAVIL) 10 MG tablet; Take 1 tablet (10 mg total) by mouth at bedtime.  Dispense: 30 tablet; Refill: 1  3. Mild major depression (HCC) Discussed management of  depression with counseling and/or medication.  She declines counseling.  She is willing to try amitriptyline since we can use it as prophylaxis for headache as well.  Will have her follow-up in 2 months to see how she is doing or sooner if needed.  In regards to the memory issue, I think we can observe for now.  Mini-Mental status exam today was good.  4. Polyarthritis - diclofenac Sodium (VOLTAREN) 1 % GEL; Apply 2 g topically 4 (four) times daily.  Dispense: 100 g; Refill: 2  5. Seasonal allergies Patient requested refill on Flonase. - fluticasone (FLONASE) 50 MCG/ACT nasal spray; Place 1 spray into both nostrils daily as needed for allergies or rhinitis.  Dispense: 16 g; Refill: 1   Addendum/16/2025: History of iron deficiency anemia.  CBC from this visit reveals mild normocytic anemia.  Will add iron studies.  Restart iron supplement.  Patient was given the opportunity to ask questions.  Patient verbalized understanding of the plan and was able to repeat key elements of the plan.   This documentation was completed using Paediatric nurse.  Any transcriptional errors are unintentional.  Orders Placed This Encounter  Procedures   CT HEAD WO CONTRAST ( )   CBC   Comprehensive metabolic panel with GFR     Requested Prescriptions   Signed Prescriptions Disp Refills   diclofenac Sodium (VOLTAREN) 1 % GEL 100 g 2    Sig: Apply 2 g topically 4 (four) times daily.   fluticasone (FLONASE) 50 MCG/ACT nasal spray 16 g 1    Sig: Place 1 spray into both nostrils daily as needed for allergies or rhinitis.   amitriptyline (ELAVIL) 10 MG tablet 30 tablet 1    Sig: Take 1 tablet (10 mg total) by mouth at bedtime.    Return in about 2 months (around 03/08/2024).  Concetta Dee, MD, FACP

## 2024-01-08 ENCOUNTER — Other Ambulatory Visit: Payer: Self-pay

## 2024-01-08 ENCOUNTER — Encounter: Payer: Self-pay | Admitting: Internal Medicine

## 2024-01-08 LAB — COMPREHENSIVE METABOLIC PANEL WITH GFR
ALT: 20 IU/L (ref 0–32)
AST: 26 IU/L (ref 0–40)
Albumin: 4.4 g/dL (ref 3.8–4.9)
Alkaline Phosphatase: 88 IU/L (ref 44–121)
BUN/Creatinine Ratio: 27 — ABNORMAL HIGH (ref 9–23)
BUN: 17 mg/dL (ref 6–24)
Bilirubin Total: 0.4 mg/dL (ref 0.0–1.2)
CO2: 22 mmol/L (ref 20–29)
Calcium: 9.3 mg/dL (ref 8.7–10.2)
Chloride: 106 mmol/L (ref 96–106)
Creatinine, Ser: 0.62 mg/dL (ref 0.57–1.00)
Globulin, Total: 2.2 g/dL (ref 1.5–4.5)
Glucose: 95 mg/dL (ref 70–99)
Potassium: 4.7 mmol/L (ref 3.5–5.2)
Sodium: 142 mmol/L (ref 134–144)
Total Protein: 6.6 g/dL (ref 6.0–8.5)
eGFR: 106 mL/min/{1.73_m2} (ref >59)

## 2024-01-08 LAB — CBC
Hematocrit: 35.9 % (ref 34.0–46.6)
Hemoglobin: 10.9 g/dL — ABNORMAL LOW (ref 11.1–15.9)
MCH: 24.3 pg — ABNORMAL LOW (ref 26.6–33.0)
MCHC: 30.4 g/dL — ABNORMAL LOW (ref 31.5–35.7)
MCV: 80 fL (ref 79–97)
Platelets: 225 10*3/uL (ref 150–450)
RBC: 4.48 x10E6/uL (ref 3.77–5.28)
RDW: 16.2 % — ABNORMAL HIGH (ref 11.7–15.4)
WBC: 4.8 10*3/uL (ref 3.4–10.8)

## 2024-01-08 MED ORDER — FERROUS SULFATE 325 (65 FE) MG PO TABS
325.0000 mg | ORAL_TABLET | Freq: Every day | ORAL | 0 refills | Status: DC
  Filled 2024-01-08: qty 90, 90d supply, fill #0
  Filled 2024-03-09: qty 90, 90d supply, fill #1
  Filled 2024-04-14: qty 10, 10d supply, fill #1

## 2024-01-08 NOTE — Addendum Note (Signed)
 Addended by: Concetta Dee B on: 01/08/2024 10:47 AM   Modules accepted: Orders

## 2024-01-09 ENCOUNTER — Other Ambulatory Visit: Payer: Self-pay

## 2024-01-10 ENCOUNTER — Encounter: Payer: Self-pay | Admitting: Internal Medicine

## 2024-01-10 LAB — SPECIMEN STATUS REPORT

## 2024-01-10 LAB — IRON,TIBC AND FERRITIN PANEL
Ferritin: 5 ng/mL — ABNORMAL LOW (ref 15–150)
Iron Saturation: 8 % — CL (ref 15–55)
Iron: 39 ug/dL (ref 27–159)
Total Iron Binding Capacity: 510 ug/dL — ABNORMAL HIGH (ref 250–450)
UIBC: 471 ug/dL — ABNORMAL HIGH (ref 131–425)

## 2024-01-16 ENCOUNTER — Ambulatory Visit (HOSPITAL_COMMUNITY)
Admission: RE | Admit: 2024-01-16 | Discharge: 2024-01-16 | Disposition: A | Discharge status: Home / Self Care | Payer: Self-pay | Source: Ambulatory Visit | Attending: Internal Medicine | Admitting: Internal Medicine

## 2024-01-16 DIAGNOSIS — R42 Dizziness and giddiness: Secondary | ICD-10-CM | POA: Insufficient documentation

## 2024-01-16 DIAGNOSIS — R519 Headache, unspecified: Secondary | ICD-10-CM | POA: Insufficient documentation

## 2024-02-08 ENCOUNTER — Ambulatory Visit: Payer: Self-pay | Admitting: Internal Medicine

## 2024-03-10 ENCOUNTER — Other Ambulatory Visit: Payer: Self-pay

## 2024-03-10 ENCOUNTER — Encounter: Payer: Self-pay | Admitting: Internal Medicine

## 2024-03-10 ENCOUNTER — Telehealth: Payer: Self-pay | Admitting: Pharmacist

## 2024-03-10 ENCOUNTER — Ambulatory Visit: Payer: Self-pay | Attending: Internal Medicine | Admitting: Internal Medicine

## 2024-03-10 VITALS — BP 110/80 | HR 68 | Temp 97.9°F | Ht 67.0 in | Wt 150.0 lb

## 2024-03-10 DIAGNOSIS — F17200 Nicotine dependence, unspecified, uncomplicated: Secondary | ICD-10-CM | POA: Insufficient documentation

## 2024-03-10 DIAGNOSIS — Z1231 Encounter for screening mammogram for malignant neoplasm of breast: Secondary | ICD-10-CM

## 2024-03-10 DIAGNOSIS — F17210 Nicotine dependence, cigarettes, uncomplicated: Secondary | ICD-10-CM

## 2024-03-10 DIAGNOSIS — D509 Iron deficiency anemia, unspecified: Secondary | ICD-10-CM

## 2024-03-10 DIAGNOSIS — N951 Menopausal and female climacteric states: Secondary | ICD-10-CM

## 2024-03-10 MED ORDER — ESTRADIOL 0.025 MG/24HR TD PTWK
0.0250 mg | MEDICATED_PATCH | TRANSDERMAL | 6 refills | Status: DC
  Filled 2024-03-10: qty 4, 28d supply, fill #0

## 2024-03-10 MED ORDER — PROGESTERONE MICRONIZED 100 MG PO CAPS
ORAL_CAPSULE | ORAL | 1 refills | Status: DC
  Filled 2024-03-10: qty 36, 84d supply, fill #0
  Filled 2024-05-20 (×2): qty 36, 84d supply, fill #1

## 2024-03-10 MED ORDER — IRON 18 MG/15ML PO LIQD
52.5000 mL | Freq: Every day | ORAL | 1 refills | Status: DC
  Filled 2024-03-10 – 2024-05-20 (×3): qty 1440, fill #0

## 2024-03-10 NOTE — Telephone Encounter (Signed)
 Do we have the estradiol pill 1 mg?

## 2024-03-10 NOTE — Telephone Encounter (Signed)
 Dr. Lincoln Renshaw,   I apologize. You and I were discussing this patient this morning (03/10/24) and estrogen patch. Our pharmacy used to dispense estrogen patches for uninsured patients, however, with the recent price changes that took effect we can no longer dispense these for a discounted price. The price for Climara is ~$95 for a month's supply. She told our pharmacy she cannot afford this.   Luckily, we have her going upstairs to apply for [place] Medicaid. Medicaid covers estrogen patches for $4 copay. Therefore, she is going to apply and will let us  know once she hears back from them for approval. If she is denied for Medicaid we will have to pursue other options for her symptoms.

## 2024-03-10 NOTE — Progress Notes (Signed)
 Patient ID: April Calhoun, female    DOB: 09-29-1970  MRN: [id number]  CC: Follow-up (Follow-up./Reports dizziness is resolving with iron supplement - discuss iron drops /)   Subjective: April Calhoun is a 53 y.o. female who presents for chronic ds management. Her concerns today include:  IDA, Collagenous colitis (Dr. Dominic Friendly), vit D def  AMN Language interpreter used during this encounter. #[id number], Dariel   Discussed the use of AI scribe software for clinical note transcription with the patient, who gave verbal consent to proceed.  History of Present Illness April Calhoun is a 53 year old female with iron deficiency anemia who presents with persistent fatigue and gastrointestinal side effects from iron supplementation.  Two months ago, dizziness and mild headache led to a CT scan of the head, which was negative. Blood tests revealed iron deficiency anemia, and she began daily iron supplementation that was prescribed. Since starting the supplement, dizziness has improved, but she continues to experience some fatigue. Reports gastrointestinal discomfort and diarrhea, with the iron pill.  She is wondering about changing to the liquid iron to see if she tolerates it better.  She is postmenopausal x 1-1/2 to 2 years.  She is up-to-date with colonoscopy..  She is postmenopausal for approximately one and a half to two years, experiencing hot flashes, night sweats, fatigue, and joint pain.  She is requesting something to help with the hot flashes.    She smokes infrequently, about two cigarettes per week, and is attempting to quit with nicotine  patches available at home but not yet used.    Patient Active Problem List   Diagnosis Date Noted   Iron deficiency anemia 03/10/2020   Atypical glandular cells of undetermined significance (AGUS) on cervical Pap smear 09/22/2013     Current Outpatient Medications on File Prior to Visit  Medication Sig Dispense  Refill   amitriptyline  (ELAVIL ) 10 MG tablet Take 1 tablet (10 mg total) by mouth at bedtime. 30 tablet 1   cholecalciferol (VITAMIN D3) 25 MCG (1000 UNIT) tablet Take 1,000 Units by mouth daily as needed.     diclofenac  Sodium (VOLTAREN ) 1 % GEL Apply 2 g topically 4 (four) times daily. 100 g 2   ferrous sulfate  325 (65 FE) MG tablet Take 1 tablet (325 mg total) by mouth daily with breakfast. 100 tablet 0   fluconazole  (DIFLUCAN ) 150 MG tablet Take 1 tablet (150 mg total) by mouth daily. 1 tablet 0   fluticasone  (FLONASE ) 50 MCG/ACT nasal spray Place 1 spray into both nostrils daily as needed for allergies or rhinitis. 16 g 1   Maca Root 500 MG CAPS Take by mouth.     vitamin B-12 (CYANOCOBALAMIN) 100 MCG tablet Take 100 mcg by mouth daily. (Patient not taking: Reported on 03/10/2024)     No current facility-administered medications on file prior to visit.    No Known Allergies  Social History   Socioeconomic History   Marital status: Significant Other    Spouse name: Not on file   Number of children: 2   Years of education: Not on file   Highest education level: 11th grade  Occupational History   Occupation: Programmer, applications  Tobacco Use   Smoking status: Every Day    Current packs/day: 0.25    Average packs/day: 0.3 packs/day for 34.0 years (8.5 ttl pk-yrs)    Types: Cigarettes   Smokeless tobacco: Never   Tobacco comments:    2 cigs per day  Vaping  Use   Vaping status: Never Used  Substance and Sexual Activity   Alcohol use: Yes    Comment: social rare alcohol    Drug use: No   Sexual activity: Yes    Birth control/protection: Condom  Other Topics Concern   Not on file  Social History Narrative   Not on file   Social Drivers of Health   Financial Resource Strain: Medium Risk (01/07/2024)   Overall Financial Resource Strain (CARDIA)    Difficulty of Paying Living Expenses: Somewhat hard  Food Insecurity: Food Insecurity Present (01/07/2024)   Hunger Vital Sign     Worried About Running Out of Food in the Last Year: Sometimes true    Ran Out of Food in the Last Year: Sometimes true  Transportation Needs: No Transportation Needs (12/13/2022)   PRAPARE - Administrator, Civil Service (Medical): No    Lack of Transportation (Non-Medical): No  Physical Activity: Sufficiently Active (01/07/2024)   Exercise Vital Sign    Days of Exercise per Week: 3 days    Minutes of Exercise per Session: 60 min  Stress: Stress Concern Present (01/07/2024)   [employer] of Occupational Health - Occupational Stress Questionnaire    Feeling of Stress : To some extent  Social Connections: Moderately Integrated (01/07/2024)   Social Connection and Isolation Panel    Frequency of Communication with Friends and Family: More than three times a week    Frequency of Social Gatherings with Friends and Family: Once a week    Attends Religious Services: More than 4 times per year    Active Member of [employer] or Organizations: No    Attends Banker Meetings: Never    Marital Status: Living with partner  Intimate Partner Violence: Not At Risk (01/07/2024)   Humiliation, Afraid, Rape, and Kick questionnaire    Fear of Current or Ex-Partner: No    Emotionally Abused: No    Physically Abused: No    Sexually Abused: No    Family History  Problem Relation Age of Onset   Hyperlipidemia Mother    Hyperlipidemia Sister    Colon cancer Paternal Aunt    Diabetes Maternal Grandmother    Diabetes Paternal Grandmother    Colon polyps Neg Hx    Esophageal cancer Neg Hx    Rectal cancer Neg Hx    Stomach cancer Neg Hx    Breast cancer Neg Hx     Past Surgical History:  Procedure Laterality Date   COLONOSCOPY     ESOPHAGOGASTRODUODENOSCOPY      ROS: Review of Systems Negative except as stated above  PHYSICAL EXAM: BP 110/80 (BP Location: Left Arm, Patient Position: Sitting, Cuff Size: Normal)   Pulse 68   Temp 97.9 F (36.6 C) (Oral)   Ht 5' 7  (1.702 m)   Wt 150 lb (68 kg)   SpO2 98%   BMI 23.49 kg/m   Wt Readings from Last 3 Encounters:  03/10/24 150 lb (68 kg)  01/07/24 151 lb (68.5 kg)  10/08/23 148 lb (67.1 kg)    Physical Exam  General appearance - alert, well appearing, middle-aged Hispanic female and in no distress Mental status - alert, oriented to person, place, and time, normal mood, behavior, speech, dress, motor activity, and thought processes Chest - clear to auscultation, no wheezes, rales or rhonchi, symmetric air entry Heart - normal rate, regular rhythm, normal S1, S2, no murmurs, rubs, clicks or gallops Extremities - peripheral pulses normal, no pedal  edema, no clubbing or cyanosis      Latest Ref Rng & Units 01/07/2024   11:16 AM 11/15/2022   12:15 PM 05/11/2021   10:49 AM  CMP  Glucose 70 - 99 mg/dL 95  92  91   BUN 6 - 24 mg/dL 17  16  15    Creatinine 0.57 - 1.00 mg/dL 1.61  0.96  0.45   Sodium 134 - 144 mmol/L 142  143  139   Potassium 3.5 - 5.2 mmol/L 4.7  4.0  4.0   Chloride 96 - 106 mmol/L 106  105  107   CO2 20 - 29 mmol/L 22  23  20    Calcium 8.7 - 10.2 mg/dL 9.3  9.3  8.6   Total Protein 6.0 - 8.5 g/dL 6.6  6.2  5.9   Total Bilirubin 0.0 - 1.2 mg/dL 0.4  0.5  0.5   Alkaline Phos 44 - 121 IU/L 88  78  69   AST 0 - 40 IU/L 26  20  15    ALT 0 - 32 IU/L 20  14  13     Lipid Panel     Component Value Date/Time   CHOL 147 11/15/2022 1215   TRIG 86 11/15/2022 1215   HDL 61 11/15/2022 1215   CHOLHDL 2.4 11/15/2022 1215   LDLCALC 70 11/15/2022 1215    CBC    Component Value Date/Time   WBC 4.8 01/07/2024 1116   WBC 5.7 10/25/2018 1451   RBC 4.48 01/07/2024 1116   RBC 4.49 10/25/2018 1451   HGB 10.9 (L) 01/07/2024 1116   HCT 35.9 01/07/2024 1116   PLT 225 01/07/2024 1116   MCV 80 01/07/2024 1116   MCH 24.3 (L) 01/07/2024 1116   MCH 28.1 10/25/2018 1451   MCHC 30.4 (L) 01/07/2024 1116   MCHC 32.1 10/25/2018 1451   RDW 16.2 (H) 01/07/2024 1116   LYMPHSABS 1.6 12/01/2020 1030    EOSABS 0.2 12/01/2020 1030   BASOSABS 0.0 12/01/2020 1030   Lab Results  Component Value Date   IRON 39 01/07/2024   TIBC 510 (H) 01/07/2024   FERRITIN 5 (L) 01/07/2024    ASSESSMENT AND PLAN: 1. Iron deficiency anemia, unspecified iron deficiency anemia type (Primary) Patient has recurrence of iron deficiency anemia.  She is taking iron tablets but reports GI upset with it.  She would like to try liquid iron.  I have consulted with our clinical pharmacist and we will see about trying to get this through our pharmacy.  If we are unable to get it, then neck step would be to refer her to hematology. - CBC - Iron, TIBC and Ferritin Panel - Iron 18 MG/15ML LIQD; Take 52.5 mLs by mouth daily.  Dispense: 1440 mL; Refill: 1  2. Tobacco dependence Strongly advised to quit smoking.  She states that she is trying to quit.  Encouraged her to use the nicotine  patches that she has at home.  3. Hot flash, menopausal Patient with vasomotor symptoms from menopause.  We discussed benefits and risks of using HRT versus one of the other nonhormonal med like Gabapentin. Does not have insurance so Marietta Shorter would not be an option due to cost.  She is wanting to try HRT.  No family history of breast CA.  No personal history of blood clots or liver disease. We will try her with the estradiol patch that she will apply once a week.  Went over the importance of taking progesterone while on the estrogen patch  to help protect the lining of the uterus.  She expressed understanding. Discussed the importance of keeping up-to-date with mammograms while on HRT as there is a slight risk of breast CA after being on HRT for 5 yrs - estradiol (CLIMARA - DOSED IN MG/24 HR) 0.025 mg/24hr patch; Place 1 patch (0.025 mg total) onto the skin once a week.  Dispense: 4 patch; Refill: 6 - progesterone (PROMETRIUM) 100 MG capsule; Take 1 capsule by mouth daily the first 12 days of each month for as long as you are on the estrogen patch.   Dispense: 36 capsule; Refill: 1  4. Encounter for screening mammogram for malignant neoplasm of breast Patient given mammogram scholarship today. - MM Digital Screening; Future   Patient was given the opportunity to ask questions.  Patient verbalized understanding of the plan and was able to repeat key elements of the plan.   This documentation was completed using Paediatric nurse.  Any transcriptional errors are unintentional.  Orders Placed This Encounter  Procedures   MM Digital Screening   CBC   Iron, TIBC and Ferritin Panel     Requested Prescriptions   Signed Prescriptions Disp Refills   estradiol (CLIMARA - DOSED IN MG/24 HR) 0.025 mg/24hr patch 4 patch 6    Sig: Place 1 patch (0.025 mg total) onto the skin once a week.   progesterone (PROMETRIUM) 100 MG capsule 36 capsule 1    Sig: Take 1 capsule by mouth daily the first 12 days of each month for as long as you are on the estrogen patch.   Iron 18 MG/15ML LIQD 1440 mL 1    Sig: Take 52.5 mLs by mouth daily.    Return in about 3 months (around 06/10/2024).  Concetta Dee, MD, FACP

## 2024-03-10 NOTE — Patient Instructions (Signed)
 VISIT SUMMARY:  Today, we discussed your ongoing issues with iron deficiency anemia, menopausal symptoms, and tobacco use. We reviewed your current symptoms and made adjustments to your treatment plan to help manage your conditions more effectively.  YOUR PLAN:  -IRON DEFICIENCY ANEMIA: Iron deficiency anemia is a condition where your body lacks enough iron to produce healthy red blood cells. We noted that while your dizziness has improved with iron supplements, you still experience fatigue and gastrointestinal discomfort. We will check with the clinical pharmacist about switching you to a liquid iron supplement to reduce side effects. We will also recheck your blood levels and refer you to a hematologist for intravenous iron if your levels do not improve.  -MENOPAUSAL SYMPTOMS: Menopausal symptoms occur due to hormonal changes in your body and can include hot flashes, night sweats, fatigue, and joint pain. We discussed starting hormone replacement therapy (HRT) with a weekly estrogen patch and progesterone for the first 12 days of each month. You should report any leg swelling or unexplained shortness of breath immediately. We also provided you with a mammogram order to monitor your breast health.  -TOBACCO USE: Smoking, even infrequently, can have negative health effects. You are trying to quit smoking and have nicotine  patches at home. We encourage you to start using the nicotine  patches and consider purchasing nicotine  gum to help with cravings.  INSTRUCTIONS:  Please follow up with the clinical pharmacist about the availability of liquid iron supplements. We will recheck your complete blood count and iron levels soon. If your iron levels do not improve, we will refer you to a hematologist for intravenous iron. Start using the estrogen patch weekly and take progesterone for the first 12 days of each month. Report any leg swelling or unexplained shortness of breath immediately. Schedule your  mammogram as soon as possible. Begin using your nicotine  patches and consider buying nicotine  gum to assist with quitting smoking.

## 2024-03-11 ENCOUNTER — Ambulatory Visit: Payer: Self-pay | Admitting: Internal Medicine

## 2024-03-11 ENCOUNTER — Other Ambulatory Visit: Payer: Self-pay

## 2024-03-11 LAB — CBC
Hematocrit: 42.7 % (ref 34.0–46.6)
Hemoglobin: 13.3 g/dL (ref 11.1–15.9)
MCH: 28.1 pg (ref 26.6–33.0)
MCHC: 31.1 g/dL — ABNORMAL LOW (ref 31.5–35.7)
MCV: 90 fL (ref 79–97)
Platelets: 197 10*3/uL (ref 150–450)
RBC: 4.74 x10E6/uL (ref 3.77–5.28)
RDW: 18.7 % — ABNORMAL HIGH (ref 11.7–15.4)
WBC: 5 10*3/uL (ref 3.4–10.8)

## 2024-03-11 LAB — IRON,TIBC AND FERRITIN PANEL
Ferritin: 26 ng/mL (ref 15–150)
Iron Saturation: 24 % (ref 15–55)
Iron: 96 ug/dL (ref 27–159)
Total Iron Binding Capacity: 395 ug/dL (ref 250–450)
UIBC: 299 ug/dL (ref 131–425)

## 2024-03-11 MED ORDER — ESTRADIOL 0.5 MG PO TABS
0.5000 mg | ORAL_TABLET | Freq: Every day | ORAL | 2 refills | Status: DC
Start: 2024-03-11 — End: 2024-07-29
  Filled 2024-03-11: qty 30, 30d supply, fill #0
  Filled 2024-04-14: qty 30, 30d supply, fill #1
  Filled 2024-05-20 (×2): qty 30, 30d supply, fill #2

## 2024-03-11 NOTE — Telephone Encounter (Signed)
 Let patient know that that I was informed by the pharmacy that the estrogen patch would be cost prohibitive for her.  I have changed it to the estrogen pill instead.  She will take the pill once a day.  Be sure to take the progesterone on days 1-12 of each month as prescribed while on the estrogen pill.

## 2024-03-11 NOTE — Telephone Encounter (Signed)
 Called & spoke to the patient. Verified & name & DOB. Informed of medication change. Informed that pharmacy has medication ready for her. Informed to take progesterone on days 1-12 days of each month as prescribed while on the estrogen pill. Patient expressed verbal understanding.

## 2024-03-11 NOTE — Addendum Note (Signed)
 Addended by: Concetta Dee B on: 03/11/2024 08:26 AM   Modules accepted: Orders

## 2024-03-12 ENCOUNTER — Telehealth: Payer: Self-pay

## 2024-03-12 ENCOUNTER — Other Ambulatory Visit: Payer: Self-pay

## 2024-03-12 NOTE — Telephone Encounter (Signed)
 Telephoned patient using interpreter#[id number]. No answer and voice mail not an option at this time. [facility] (scholarship)

## 2024-04-13 NOTE — Telephone Encounter (Signed)
 Telephoned patient at mobile number using interpreter#[id number]. Patient will call back or I will call her back. [facility] (scholarship).

## 2024-04-14 ENCOUNTER — Other Ambulatory Visit: Payer: Self-pay

## 2024-04-14 ENCOUNTER — Other Ambulatory Visit: Payer: Self-pay | Admitting: Internal Medicine

## 2024-04-14 DIAGNOSIS — D649 Anemia, unspecified: Secondary | ICD-10-CM

## 2024-04-14 MED ORDER — FERROUS SULFATE 325 (65 FE) MG PO TABS
325.0000 mg | ORAL_TABLET | Freq: Every day | ORAL | 0 refills | Status: DC
  Filled 2024-04-14: qty 100, 100d supply, fill #0

## 2024-04-16 ENCOUNTER — Other Ambulatory Visit: Payer: Self-pay

## 2024-05-20 ENCOUNTER — Other Ambulatory Visit: Payer: Self-pay

## 2024-05-21 ENCOUNTER — Other Ambulatory Visit: Payer: Self-pay

## 2024-06-11 ENCOUNTER — Encounter: Payer: Self-pay | Admitting: Internal Medicine

## 2024-06-11 ENCOUNTER — Ambulatory Visit: Payer: Self-pay | Attending: Internal Medicine | Admitting: Internal Medicine

## 2024-06-11 VITALS — BP 100/65 | HR 65 | Temp 98.3°F | Ht 67.0 in | Wt 154.0 lb

## 2024-06-11 DIAGNOSIS — Z2821 Immunization not carried out because of patient refusal: Secondary | ICD-10-CM

## 2024-06-11 DIAGNOSIS — D509 Iron deficiency anemia, unspecified: Secondary | ICD-10-CM

## 2024-06-11 DIAGNOSIS — Z23 Encounter for immunization: Secondary | ICD-10-CM

## 2024-06-11 DIAGNOSIS — F17200 Nicotine dependence, unspecified, uncomplicated: Secondary | ICD-10-CM

## 2024-06-11 DIAGNOSIS — F330 Major depressive disorder, recurrent, mild: Secondary | ICD-10-CM

## 2024-06-11 DIAGNOSIS — N950 Postmenopausal bleeding: Secondary | ICD-10-CM

## 2024-06-11 DIAGNOSIS — N951 Menopausal and female climacteric states: Secondary | ICD-10-CM

## 2024-06-11 NOTE — Progress Notes (Signed)
 Patient ID: April Calhoun, female    DOB: 16-Sep-1971  MRN: [id number]  CC: Follow-up (Follow-up. /Reports that hormone replacement has been helping /Mentrual cycle returned /No to flu vax. Yes to shingles vax)   Subjective: April Calhoun is a 53 y.o. female who presents for chronic ds management. Husband, Eric, is with her Her concerns today include:  IDA, Collagenous colitis (Dr. Legrand), vit D def   [employer] Language interpreter used during this encounter. #[id number], Aracely   Discussed the use of AI scribe software for clinical note transcription with the patient, who gave verbal consent to proceed.  History of Present Illness April Calhoun is a 53 year old female who presents for follow-up of anemia and hot flashes.  Hot flashes: On last visit she was started on HRT of estrogen pill to take daily and oral progesterone  for the first twelve days of each month. Since starting this regimen, she rarely experiences hot flashes.  She began experiencing menstrual bleeding on September 5th, 2025, which lasted for five days. The bleeding was heavy for the first two days and then lightened. She is concerned about the return of her menstrual cycle while on hormone replacement therapy. Prior to this, she has not had a cycle for 2 yrs.  IDA: She has a history of anemia with improved iron  levels at her last visit. She continues to take iron  supplements four days a week.  Tob dep: She smokes one to two cigarettes per day and has patches to help quit but has not yet used them.  POs Dep screen: She experiences depression on some days but feels she can manage it. She takes amitriptyline  at night when feeling particularly anxious, which she finds helpful.  HM: Had her sign form for mammogram scholarship on last visit.  [facility] but she did not call them back     Patient Active Problem List   Diagnosis Date Noted   Tobacco  dependence 03/10/2024   Iron  deficiency anemia 03/10/2020   Atypical glandular cells of undetermined significance (AGUS) on cervical Pap smear 09/22/2013     Current Outpatient Medications on File Prior to Visit  Medication Sig Dispense Refill   amitriptyline  (ELAVIL ) 10 MG tablet Take 1 tablet (10 mg total) by mouth at bedtime. 30 tablet 1   cholecalciferol (VITAMIN D3) 25 MCG (1000 UNIT) tablet Take 1,000 Units by mouth daily as needed.     diclofenac  Sodium (VOLTAREN ) 1 % GEL Apply 2 grams topically 4 (four) times daily. 100 g 2   estradiol  (ESTRACE ) 0.5 MG tablet Take 1 tablet (0.5 mg total) by mouth daily. 30 tablet 2   ferrous sulfate  (FEROSUL) 325 (65 FE) MG tablet Take 1 tablet (325 mg total) by mouth daily with breakfast. 100 tablet 0   fluconazole  (DIFLUCAN ) 150 MG tablet Take 1 tablet (150 mg total) by mouth daily. 1 tablet 0   fluticasone  (FLONASE ) 50 MCG/ACT nasal spray Place 1 spray into both nostrils daily as needed for allergies or rhinitis. 16 g 1   Iron  18 MG/15ML LIQD Take 52.5 mLs by mouth daily. 1440 mL 1   Maca Root 500 MG CAPS Take by mouth.     progesterone  (PROMETRIUM ) 100 MG capsule Take 1 capsule by mouth daily the first 12 days of each month for as long as you are on the estrogen patch. 36 capsule 1   vitamin B-12 (CYANOCOBALAMIN) 100 MCG tablet Take  100 mcg by mouth daily. (Patient not taking: Reported on 06/11/2024)     No current facility-administered medications on file prior to visit.    No Known Allergies  Social History   Socioeconomic History   Marital status: Significant Other    Spouse name: Not on file   Number of children: 2   Years of education: Not on file   Highest education level: 11th grade  Occupational History   Occupation: Programmer, applications  Tobacco Use   Smoking status: Every Day    Current packs/day: 0.25    Average packs/day: 0.3 packs/day for 34.0 years (8.5 ttl pk-yrs)    Types: Cigarettes   Smokeless tobacco: Never   Tobacco  comments:    2 cigs per day  Vaping Use   Vaping status: Never Used  Substance and Sexual Activity   Alcohol use: Yes    Comment: social rare alcohol    Drug use: No   Sexual activity: Yes    Birth control/protection: Condom  Other Topics Concern   Not on file  Social History Narrative   Not on file   Social Drivers of Health   Financial Resource Strain: Medium Risk (01/07/2024)   Overall Financial Resource Strain (CARDIA)    Difficulty of Paying Living Expenses: Somewhat hard  Food Insecurity: Food Insecurity Present (01/07/2024)   Hunger Vital Sign    Worried About Running Out of Food in the Last Year: Sometimes true    Ran Out of Food in the Last Year: Sometimes true  Transportation Needs: No Transportation Needs (12/13/2022)   PRAPARE - Administrator, Civil Service (Medical): No    Lack of Transportation (Non-Medical): No  Physical Activity: Sufficiently Active (01/07/2024)   Exercise Vital Sign    Days of Exercise per Week: 3 days    Minutes of Exercise per Session: 60 min  Stress: Stress Concern Present (01/07/2024)   [employer] of Occupational Health - Occupational Stress Questionnaire    Feeling of Stress : To some extent  Social Connections: Moderately Integrated (01/07/2024)   Social Connection and Isolation Panel    Frequency of Communication with Friends and Family: More than three times a week    Frequency of Social Gatherings with Friends and Family: Once a week    Attends Religious Services: More than 4 times per year    Active Member of [employer] or Organizations: No    Attends Banker Meetings: Never    Marital Status: Living with partner  Intimate Partner Violence: Not At Risk (01/07/2024)   Humiliation, Afraid, Rape, and Kick questionnaire    Fear of Current or Ex-Partner: No    Emotionally Abused: No    Physically Abused: No    Sexually Abused: No    Family History  Problem Relation Age of Onset   Hyperlipidemia Mother     Hyperlipidemia Sister    Colon cancer Paternal Aunt    Diabetes Maternal Grandmother    Diabetes Paternal Grandmother    Colon polyps Neg Hx    Esophageal cancer Neg Hx    Rectal cancer Neg Hx    Stomach cancer Neg Hx    Breast cancer Neg Hx     Past Surgical History:  Procedure Laterality Date   COLONOSCOPY     ESOPHAGOGASTRODUODENOSCOPY      ROS: Review of Systems Negative except as stated above  PHYSICAL EXAM: BP 100/65 (BP Location: Left Arm, Patient Position: Sitting, Cuff Size: Normal)   Pulse 65  Temp 98.3 F (36.8 C) (Oral)   Ht 5' 7 (1.702 m)   Wt 154 lb (69.9 kg)   SpO2 97%   BMI 24.12 kg/m   Physical Exam  General appearance - alert, well appearing, and in no distress Mental status - normal mood, behavior, speech, dress, motor activity, and thought processes Chest - clear to auscultation, no wheezes, rales or rhonchi, symmetric air entry Heart - normal rate, regular rhythm, normal S1, S2, no murmurs, rubs, clicks or gallops Extremities - peripheral pulses normal, no pedal edema, no clubbing or cyanosis     06/11/2024    9:05 AM 03/10/2024   10:08 AM 03/10/2024    9:32 AM  Depression screen PHQ 2/9  Decreased Interest 2 0 2  Down, Depressed, Hopeless 2 0 2  PHQ - 2 Score 4 0 4  Altered sleeping 1 0 1  Tired, decreased energy 2 0 2  Change in appetite 1 0 1  Feeling bad or failure about yourself  2 0 2  Trouble concentrating 0 0 2  Moving slowly or fidgety/restless 0 0 0  Suicidal thoughts 0 0 0  PHQ-9 Score 10 0 12  Difficult doing work/chores Somewhat difficult Not difficult at all Somewhat difficult      03/10/2024   10:08 AM 03/10/2024    9:32 AM 01/07/2024    9:16 AM 05/16/2023   10:28 AM  GAD 7 : Generalized Anxiety Score  Nervous, Anxious, on Edge 0 2 2 2   Control/stop worrying 0 2 3 2   Worry too much - different things 0 2 3 2   Trouble relaxing 0 0 2 0  Restless 0 0 0 0  Easily annoyed or irritable 0 1 2 2   Afraid - awful might happen  0 2 2 2   Total GAD 7 Score 0 9 14 10   Anxiety Difficulty Not difficult at all Somewhat difficult Somewhat difficult          Latest Ref Rng & Units 01/07/2024   11:16 AM 11/15/2022   12:15 PM 05/11/2021   10:49 AM  CMP  Glucose 70 - 99 mg/dL 95  92  91   BUN 6 - 24 mg/dL 17  16  15    Creatinine 0.57 - 1.00 mg/dL 9.37  9.45  9.44   Sodium 134 - 144 mmol/L 142  143  139   Potassium 3.5 - 5.2 mmol/L 4.7  4.0  4.0   Chloride 96 - 106 mmol/L 106  105  107   CO2 20 - 29 mmol/L 22  23  20    Calcium 8.7 - 10.2 mg/dL 9.3  9.3  8.6   Total Protein 6.0 - 8.5 g/dL 6.6  6.2  5.9   Total Bilirubin 0.0 - 1.2 mg/dL 0.4  0.5  0.5   Alkaline Phos 44 - 121 IU/L 88  78  69   AST 0 - 40 IU/L 26  20  15    ALT 0 - 32 IU/L 20  14  13     Lipid Panel     Component Value Date/Time   CHOL 147 11/15/2022 1215   TRIG 86 11/15/2022 1215   HDL 61 11/15/2022 1215   CHOLHDL 2.4 11/15/2022 1215   LDLCALC 70 11/15/2022 1215    CBC    Component Value Date/Time   WBC 5.0 03/10/2024 1058   WBC 5.7 10/25/2018 1451   RBC 4.74 03/10/2024 1058   RBC 4.49 10/25/2018 1451   HGB 13.3 03/10/2024 1058  HCT 42.7 03/10/2024 1058   PLT 197 03/10/2024 1058   MCV 90 03/10/2024 1058   MCH 28.1 03/10/2024 1058   MCH 28.1 10/25/2018 1451   MCHC 31.1 (L) 03/10/2024 1058   MCHC 32.1 10/25/2018 1451   RDW 18.7 (H) 03/10/2024 1058   LYMPHSABS 1.6 12/01/2020 1030   EOSABS 0.2 12/01/2020 1030   BASOSABS 0.0 12/01/2020 1030   Lab Results  Component Value Date   IRON  96 03/10/2024   TIBC 395 03/10/2024   FERRITIN 26 03/10/2024    ASSESSMENT AND PLAN: 1. Iron  deficiency anemia, unspecified iron  deficiency anemia type (Primary) Continue iron  4 days a week.  Recheck CBC and iron  levels today. - CBC - Iron , TIBC and Ferritin Panel  2. Post-menopausal bleeding Patient developed break through vaginal bleeding after being on HRT for 3 months having not had menses for 2 yrs prior to this. Will have her hold the  estrogen and progesterone  and try to get her in with gynecology. - Ambulatory referral to Gynecology - US  Pelvic Complete With Transvaginal; Future  3. Hot flash, menopausal See #2 above  4. Tobacco dependence Strongly advised to quit.  She has the nicotine  patches available to use when ready to give a trial of quitting.  5. Major depressive disorder, recurrent episode, mild (HCC) Patient reports this is not a major issue for her at this time.  She takes Elavil  at bedtime.  6. Influenza vaccination declined   7. Need for shingles vaccine Patient advised there is a cost associated with getting shingles vaccine and I recommend that she try to get it through the health department  HM: given # number to call [facility] back to schedule [facility]  Patient was given the opportunity to ask questions.  Patient verbalized understanding of the plan and was able to repeat key elements of the plan.   This documentation was completed using Paediatric nurse.  Any transcriptional errors are unintentional.  No orders of the defined types were placed in this encounter.    Requested Prescriptions    No prescriptions requested or ordered in this encounter    No follow-ups on file.  Barnie Louder, MD, FACP

## 2024-06-11 NOTE — Patient Instructions (Signed)
 VISIT SUMMARY:  Today, we discussed your anemia, hot flashes, and other health concerns. We reviewed your current treatments and made some adjustments to better manage your symptoms and overall health.  YOUR PLAN:  -ABNORMAL UTERINE BLEEDING ON HORMONE REPLACEMENT THERAPY (POSTMENOPAUSAL BLEEDING): Postmenopausal bleeding while on hormone replacement therapy is not normal and could be a sign of a serious condition like uterine cancer. We will stop your hormone replacement therapy and order a pelvic ultrasound to check your uterine lining. You will also be referred to a gynecologist for further evaluation.  -ANEMIA: Anemia is a condition where you don't have enough healthy red blood cells to carry adequate oxygen to your body's tissues. We will check your iron  levels and anemia status with lab work today to ensure your condition is still improving.  -TOBACCO USE: Smoking can have many negative effects on your health. You have nicotine  patches available to help you quit smoking, and I encourage you to try using them when you feel ready.  -DEPRESSION: Depression is a mood disorder that causes a persistent feeling of sadness and loss of interest. You are managing your depression with amitriptyline  as needed for anxiety, which is helping. Continue taking it as needed.  -GENERAL HEALTH MAINTENANCE: It is important to keep up with routine health screenings and vaccinations. You have not yet completed your mammogram, so we will contact the provider to schedule it. You are interested in the shingles vaccine, and we will consider getting it through the health department due to issues with the pharmacy.  INSTRUCTIONS:  Please stop taking your hormone replacement therapy immediately. We will schedule a pelvic ultrasound and refer you to a gynecologist for further evaluation. Additionally, we will contact the mammogram provider to schedule your appointment. Continue taking your iron  supplements and  amitriptyline  as needed. Consider using the nicotine  patches to help quit smoking when you feel ready.

## 2024-06-12 LAB — CBC
Hematocrit: 41 % (ref 34.0–46.6)
Hemoglobin: 13.6 g/dL (ref 11.1–15.9)
MCH: 31.6 pg (ref 26.6–33.0)
MCHC: 33.2 g/dL (ref 31.5–35.7)
MCV: 95 fL (ref 79–97)
Platelets: 204 x10E3/uL (ref 150–450)
RBC: 4.31 x10E6/uL (ref 3.77–5.28)
RDW: 12.8 % (ref 11.7–15.4)
WBC: 4.9 x10E3/uL (ref 3.4–10.8)

## 2024-06-12 LAB — IRON,TIBC AND FERRITIN PANEL
Ferritin: 44 ng/mL (ref 15–150)
Iron Saturation: 34 % (ref 15–55)
Iron: 134 ug/dL (ref 27–159)
Total Iron Binding Capacity: 390 ug/dL (ref 250–450)
UIBC: 256 ug/dL (ref 131–425)

## 2024-06-13 ENCOUNTER — Ambulatory Visit: Payer: Self-pay | Admitting: Internal Medicine

## 2024-06-17 ENCOUNTER — Ambulatory Visit (HOSPITAL_COMMUNITY)
Admission: RE | Admit: 2024-06-17 | Discharge: 2024-06-17 | Disposition: A | Discharge status: Home / Self Care | Payer: Self-pay | Source: Ambulatory Visit | Attending: Internal Medicine | Admitting: Internal Medicine

## 2024-06-17 DIAGNOSIS — N950 Postmenopausal bleeding: Secondary | ICD-10-CM | POA: Insufficient documentation

## 2024-07-09 ENCOUNTER — Ambulatory Visit
Admission: RE | Admit: 2024-07-09 | Discharge: 2024-07-09 | Disposition: A | Discharge status: Home / Self Care | Source: Ambulatory Visit | Attending: Internal Medicine | Admitting: Internal Medicine

## 2024-07-09 DIAGNOSIS — Z1231 Encounter for screening mammogram for malignant neoplasm of breast: Secondary | ICD-10-CM

## 2024-07-29 ENCOUNTER — Telehealth: Payer: Self-pay | Admitting: Internal Medicine

## 2024-07-29 ENCOUNTER — Other Ambulatory Visit: Payer: Self-pay

## 2024-07-29 ENCOUNTER — Other Ambulatory Visit: Payer: Self-pay | Admitting: Internal Medicine

## 2024-07-29 DIAGNOSIS — N951 Menopausal and female climacteric states: Secondary | ICD-10-CM

## 2024-07-29 MED ORDER — PROGESTERONE MICRONIZED 100 MG PO CAPS
100.0000 mg | ORAL_CAPSULE | Freq: Every day | ORAL | 2 refills | Status: DC
  Filled 2024-07-29: qty 30, 30d supply, fill #0

## 2024-07-29 MED ORDER — PROGESTERONE MICRONIZED 100 MG PO CAPS
100.0000 mg | ORAL_CAPSULE | Freq: Every day | ORAL | 2 refills | Status: DC
  Filled 2024-07-29: qty 30, 30d supply, fill #0
  Filled 2024-08-31: qty 60, 60d supply, fill #1

## 2024-07-29 MED ORDER — ESTRADIOL 0.5 MG PO TABS
0.5000 mg | ORAL_TABLET | Freq: Every day | ORAL | 2 refills | Status: DC
  Filled 2024-07-29: qty 30, 30d supply, fill #0
  Filled 2024-08-31: qty 60, 60d supply, fill #1

## 2024-07-29 NOTE — Telephone Encounter (Signed)
 PC placed to pt today. I inquired whether she got appt as yet with GYN and pt states she has not.  Informed her that I communicated with one of the gynecologist, Dr. Jeralyn, about her case. She said advised to keep the Progesterone  continuous not cyclic since pt was amenorrheic prior to starting her on HRT. The bleeding is likely withdrawal from the progesterone . The fact that the uterine lining on US  was not thicked is reassuring. Advised pt that we can restart the Estrogen and Progesterone  if she is still having the bothersome hot flashes. Pt states the flashes returned when she stopped the meds. She would like to resume. Will take both estrogen and Progesterone  daily. Advise to expect some bleeding the first few mths after being on HRT but if it persist, please stop and let me know. She is up to date with her MMG. Keep f/u with me in January. All questions were answered.  [employer]: Tobias [phone] used.

## 2024-08-03 ENCOUNTER — Other Ambulatory Visit: Payer: Self-pay

## 2024-08-31 ENCOUNTER — Other Ambulatory Visit: Payer: Self-pay

## 2024-08-31 ENCOUNTER — Other Ambulatory Visit (HOSPITAL_COMMUNITY): Payer: Self-pay

## 2024-10-15 ENCOUNTER — Other Ambulatory Visit: Payer: Self-pay

## 2024-10-15 ENCOUNTER — Ambulatory Visit: Payer: Self-pay | Attending: Internal Medicine | Admitting: Internal Medicine

## 2024-10-15 ENCOUNTER — Encounter: Payer: Self-pay | Admitting: Internal Medicine

## 2024-10-15 VITALS — BP 101/65 | HR 62 | Temp 97.9°F | Ht 67.0 in | Wt 151.0 lb

## 2024-10-15 DIAGNOSIS — N959 Unspecified menopausal and perimenopausal disorder: Secondary | ICD-10-CM

## 2024-10-15 DIAGNOSIS — E611 Iron deficiency: Secondary | ICD-10-CM

## 2024-10-15 DIAGNOSIS — M130 Polyarthritis, unspecified: Secondary | ICD-10-CM

## 2024-10-15 DIAGNOSIS — F330 Major depressive disorder, recurrent, mild: Secondary | ICD-10-CM

## 2024-10-15 DIAGNOSIS — H9201 Otalgia, right ear: Secondary | ICD-10-CM

## 2024-10-15 DIAGNOSIS — J302 Other seasonal allergic rhinitis: Secondary | ICD-10-CM

## 2024-10-15 DIAGNOSIS — Z23 Encounter for immunization: Secondary | ICD-10-CM

## 2024-10-15 MED ORDER — PROGESTERONE MICRONIZED 100 MG PO CAPS
100.0000 mg | ORAL_CAPSULE | Freq: Every day | ORAL | 1 refills | Status: AC
  Filled 2024-10-15 – 2024-10-26 (×3): qty 90, 90d supply, fill #0

## 2024-10-15 MED ORDER — FLUTICASONE PROPIONATE 50 MCG/ACT NA SUSP
1.0000 | Freq: Every day | NASAL | 1 refills | Status: AC | PRN
  Filled 2024-10-15: qty 16, 60d supply, fill #0

## 2024-10-15 MED ORDER — ESTRADIOL 0.5 MG PO TABS
0.5000 mg | ORAL_TABLET | Freq: Every day | ORAL | 1 refills | Status: AC
  Filled 2024-10-15 – 2024-10-26 (×3): qty 90, 90d supply, fill #0

## 2024-10-15 MED ORDER — DICLOFENAC SODIUM 1 % EX GEL
2.0000 g | Freq: Four times a day (QID) | CUTANEOUS | 2 refills | Status: AC
  Filled 2024-10-15: qty 100, 13d supply, fill #0

## 2024-10-15 NOTE — Patient Instructions (Signed)
" °  VISIT SUMMARY: During your visit, we discussed your perimenopausal symptoms, depression, iron  deficiency, seasonal allergies, and recent right ear pain. Your hormone therapy is effectively managing your menopausal symptoms, and your depression has improved. Your iron  levels are stable, and we have refilled your allergy medications. We also addressed your ear pain and recommended over-the-counter pain relief.  YOUR PLAN: -MENOPAUSAL AND FEMALE CLIMACTERIC STATES: Menopausal and female climacteric states refer to the transition period leading up to and following menopause, characterized by symptoms such as hot flashes, mood swings, and body aches. Your hormone therapy with progesterone  and estrogen is effectively managing these symptoms, and you should continue with your current regimen.  -DEPRESSION: Depression is a mood disorder that causes persistent feelings of sadness and loss of interest. Your symptoms have improved, and your depression screening score has decreased. Continue taking amitriptyline  as needed when you feel unwell.  -IRON  DEFICIENCY: Iron  deficiency occurs when your body doesn't have enough iron , leading to low levels of hemoglobin. Your iron  levels have improved, and your hemoglobin is stable at 13.6, which is within the normal range. We rechecked your hemoglobin and iron  levels to ensure they remain stable.  -SEASONAL ALLERGIC RHINITIS: Seasonal allergic rhinitis is an allergic reaction to pollen and other allergens that cause symptoms like a runny nose and sneezing. We have refilled your allergy nasal spray and Voltaren  gel to help manage your symptoms.  -RIGHT EAR PAIN: Right ear pain can be caused by various factors, but your examination showed no wax buildup, normal eardrum, and non-red ear canal. Use Tylenol or Advil as needed for pain management.  INSTRUCTIONS: Please continue with your current hormone therapy regimen and take amitriptyline  as needed for depression. Use  Tylenol or Advil for your right ear pain. We have refilled your allergy nasal spray and Voltaren  gel. Follow up with routine labs to monitor your hemoglobin and iron  levels. If you experience any new symptoms or have concerns, please schedule an appointment.    Contains text generated by [employer].   "

## 2024-10-15 NOTE — Progress Notes (Signed)
 "   Patient ID: April Calhoun, female    DOB: Jun 28, 1971  MRN: [id number]  CC: Follow-up (Follow-up. /Intermittent sharp pain on RT ear X6 days/Requesting lipid panel - routine/Flu vax administered on 10/15/24 - C.A.)   Subjective: April Calhoun is a 54 y.o. female who presents for chronic ds management.  Husband, Eric, is with her  Her chronic medical issues include:  IDA, Collagenous colitis (Dr. Legrand), vit D def, MDD   [employer] Language interpreter used during this encounter. # Beckey [phone]  Discussed the use of AI scribe software for clinical note transcription with the patient, who gave verbal consent to proceed.  History of Present Illness April Calhoun is a 54 year old female who presents for a four-month follow-up visit for perimenopausal symptoms.  She has been taking progesterone  daily with estrogen for hot flashes resulting a significant reduction in hot flashes. No further vaginal bleeding.  The hormone therapy has also alleviated body aches and decreased mood swings, although some mood fluctuations persist.  Regarding her history of depression, she is taking amitriptyline  on an as-needed basis rather than daily. Her depression has improved, supported by depression screening done today showing a decreased score compared to previous assessments.  Her anemia was previously addressed, and her iron  studies had improved, allowing her to stop the iron  supplements. Her hemoglobin was stable at 13.6 g/dL during the last check.  She experiences intermittent pain in her right ear for the past six days, with no associated drainage or hearing loss.  She requested a cholesterol check, as it has been some time since her last evaluation. She is eating normally and avoiding greasy and sugary foods. She does not engage in formal exercise but notes that her daily activities, including house chores and her job in cleaning, involve significant physical activity. She  attempts to do weight-bearing exercises occasionally.  She requires refills for her allergy nasal spray and Voltaren  gel which she uses for polyarthralgia symptoms.    Patient Active Problem List   Diagnosis Date Noted   Tobacco dependence 03/10/2024   Iron  deficiency anemia 03/10/2020   Atypical glandular cells of undetermined significance (AGUS) on cervical Pap smear 09/22/2013     Medications Ordered Prior to Encounter[1]  Allergies[2]  Social History   Socioeconomic History   Marital status: Significant Other    Spouse name: Not on file   Number of children: 2   Years of education: Not on file   Highest education level: 11th grade  Occupational History   Occupation: Programmer, applications  Tobacco Use   Smoking status: Every Day    Current packs/day: 0.25    Average packs/day: 0.3 packs/day for 34.0 years (8.5 ttl pk-yrs)    Types: Cigarettes   Smokeless tobacco: Never   Tobacco comments:    2 cigs per day  Vaping Use   Vaping status: Never Used  Substance and Sexual Activity   Alcohol use: Yes    Comment: social rare alcohol    Drug use: No   Sexual activity: Yes    Birth control/protection: Condom  Other Topics Concern   Not on file  Social History Narrative   Not on file   Social Drivers of Health   Tobacco Use: High Risk (10/15/2024)   Patient History    Smoking Tobacco Use: Every Day    Smokeless Tobacco Use: Never    Passive Exposure: Not on file  Financial Resource Strain: Medium Risk (01/07/2024)   Overall Financial  Resource Strain (CARDIA)    Difficulty of Paying Living Expenses: Somewhat hard  Food Insecurity: Food Insecurity Present (01/07/2024)   Hunger Vital Sign    Worried About Running Out of Food in the Last Year: Sometimes true    Ran Out of Food in the Last Year: Sometimes true  Transportation Needs: No Transportation Needs (12/13/2022)   PRAPARE - Administrator, Civil Service (Medical): No    Lack of Transportation (Non-Medical):  No  Physical Activity: Sufficiently Active (01/07/2024)   Exercise Vital Sign    Days of Exercise per Week: 3 days    Minutes of Exercise per Session: 60 min  Stress: Stress Concern Present (01/07/2024)   [employer] of Occupational Health - Occupational Stress Questionnaire    Feeling of Stress : To some extent  Social Connections: Moderately Integrated (01/07/2024)   Social Connection and Isolation Panel    Frequency of Communication with Friends and Family: More than three times a week    Frequency of Social Gatherings with Friends and Family: Once a week    Attends Religious Services: More than 4 times per year    Active Member of [employer] or Organizations: No    Attends Banker Meetings: Never    Marital Status: Living with partner  Intimate Partner Violence: Not At Risk (01/07/2024)   Humiliation, Afraid, Rape, and Kick questionnaire    Fear of Current or Ex-Partner: No    Emotionally Abused: No    Physically Abused: No    Sexually Abused: No  Depression (PHQ2-9): Medium Risk (10/15/2024)   Depression (PHQ2-9)    PHQ-2 Score: 6  Alcohol Screen: Low Risk (01/07/2024)   Alcohol Screen    Last Alcohol Screening Score (AUDIT): 2  Housing: Low Risk (01/07/2024)   Housing Stability Vital Sign    Unable to Pay for Housing in the Last Year: No    Number of Times Moved in the Last Year: 0    Homeless in the Last Year: No  Utilities: Not At Risk (01/07/2024)   [employer]HC Utilities    Threatened with loss of utilities: No  Health Literacy: Adequate Health Literacy (01/07/2024)   B1300 Health Literacy    Frequency of need for help with medical instructions: Never    Family History  Problem Relation Age of Onset   Hyperlipidemia Mother    Hyperlipidemia Sister    Colon cancer Paternal Aunt    Diabetes Maternal Grandmother    Diabetes Paternal Grandmother    Colon polyps Neg Hx    Esophageal cancer Neg Hx    Rectal cancer Neg Hx    Stomach cancer Neg Hx    Breast cancer  Neg Hx     Past Surgical History:  Procedure Laterality Date   COLONOSCOPY     ESOPHAGOGASTRODUODENOSCOPY      ROS: Review of Systems Negative except as stated above  PHYSICAL EXAM: BP 101/65 (BP Location: Left Arm, Patient Position: Sitting, Cuff Size: Normal)   Pulse 62   Temp 97.9 F (36.6 C) (Oral)   Ht 5' 7 (1.702 m)   Wt 151 lb (68.5 kg)   SpO2 97%   BMI 23.65 kg/m   Wt Readings from Last 3 Encounters:  10/15/24 151 lb (68.5 kg)  06/11/24 154 lb (69.9 kg)  03/10/24 150 lb (68 kg)    Physical Exam General appearance - alert, well appearing, middle age Hispanic female and in no distress Mental status - normal mood, behavior, speech, dress, motor  activity, and thought processes Ears - bilateral TM's and external ear canals normal Nose - normal and patent, no erythema, discharge or polyps Mouth - mucous membranes moist, pharynx normal without lesions Neck - supple, no significant adenopathy Chest - clear to auscultation, no wheezes, rales or rhonchi, symmetric air entry Heart - normal rate, regular rhythm, normal S1, S2, no murmurs, rubs, clicks or gallops Extremities - peripheral pulses normal, no pedal edema, no clubbing or cyanosis     10/15/2024   10:53 AM 06/11/2024    9:05 AM 03/10/2024   10:08 AM  Depression screen PHQ 2/9  Decreased Interest 1 2 0  Down, Depressed, Hopeless 1 2 0  PHQ - 2 Score 2 4 0  Altered sleeping 0 1 0  Tired, decreased energy 1 2 0  Change in appetite 1 1 0  Feeling bad or failure about yourself  1 2 0  Trouble concentrating 1 0 0  Moving slowly or fidgety/restless 0 0 0  Suicidal thoughts 0 0 0  PHQ-9 Score 6 10  0   Difficult doing work/chores Somewhat difficult Somewhat difficult Not difficult at all     Data saved with a previous flowsheet row definition        Latest Ref Rng & Units 01/07/2024   11:16 AM 11/15/2022   12:15 PM 05/11/2021   10:49 AM  CMP  Glucose 70 - 99 mg/dL 95  92  91   BUN 6 - 24 mg/dL 17  16  15     Creatinine 0.57 - 1.00 mg/dL 9.37  9.45  9.44   Sodium 134 - 144 mmol/L 142  143  139   Potassium 3.5 - 5.2 mmol/L 4.7  4.0  4.0   Chloride 96 - 106 mmol/L 106  105  107   CO2 20 - 29 mmol/L 22  23  20    Calcium 8.7 - 10.2 mg/dL 9.3  9.3  8.6   Total Protein 6.0 - 8.5 g/dL 6.6  6.2  5.9   Total Bilirubin 0.0 - 1.2 mg/dL 0.4  0.5  0.5   Alkaline Phos 44 - 121 IU/L 88  78  69   AST 0 - 40 IU/L 26  20  15    ALT 0 - 32 IU/L 20  14  13     Lipid Panel     Component Value Date/Time   CHOL 147 11/15/2022 1215   TRIG 86 11/15/2022 1215   HDL 61 11/15/2022 1215   CHOLHDL 2.4 11/15/2022 1215   LDLCALC 70 11/15/2022 1215    CBC    Component Value Date/Time   WBC 4.9 06/11/2024 1025   WBC 5.7 10/25/2018 1451   RBC 4.31 06/11/2024 1025   RBC 4.49 10/25/2018 1451   HGB 13.6 06/11/2024 1025   HCT 41.0 06/11/2024 1025   PLT 204 06/11/2024 1025   MCV 95 06/11/2024 1025   MCH 31.6 06/11/2024 1025   MCH 28.1 10/25/2018 1451   MCHC 33.2 06/11/2024 1025   MCHC 32.1 10/25/2018 1451   RDW 12.8 06/11/2024 1025   LYMPHSABS 1.6 12/01/2020 1030   EOSABS 0.2 12/01/2020 1030   BASOSABS 0.0 12/01/2020 1030    ASSESSMENT AND PLAN: 1. Menopausal disorder (Primary) Doing well on low-dose estradiol  and progesterone .  We will continue these medications for now.  She is up-to-date with mammogram. - Lipid panel  2. Iron  deficiency We will recheck CBC and iron  studies today. - CBC - Iron , TIBC and Ferritin Panel  3. Major  depressive disorder, recurrent episode, mild Mild and improved from last visit based on hx and PHQ9 screening done today in the office.  She is on amitriptyline .  4. Right ear pain Reassurance given.  Ear canal and tympanic membrane are within normal limits  5. Seasonal allergies - fluticasone  (FLONASE ) 50 MCG/ACT nasal spray; Place 1 spray into both nostrils daily as needed for allergies or rhinitis.  Dispense: 16 g; Refill: 1  6. Polyarthritis - diclofenac  Sodium  (VOLTAREN ) 1 % GEL; Apply 2 grams topically 4 (four) times daily.  Dispense: 100 g; Refill: 2  7. Need for influenza vaccination Given today.   Patient was given the opportunity to ask questions.  Patient verbalized understanding of the plan and was able to repeat key elements of the plan.   This documentation was completed using Paediatric nurse.  Any transcriptional errors are unintentional.  Orders Placed This Encounter  Procedures   Flu vaccine trivalent PF, 6mos and older(Flulaval,Afluria,Fluarix,Fluzone)   CBC   Iron , TIBC and Ferritin Panel   Lipid panel     Requested Prescriptions   Signed Prescriptions Disp Refills   fluticasone  (FLONASE ) 50 MCG/ACT nasal spray 16 g 1    Sig: Place 1 spray into both nostrils daily as needed for allergies or rhinitis.   diclofenac  Sodium (VOLTAREN ) 1 % GEL 100 g 2    Sig: Apply 2 grams topically 4 (four) times daily.   estradiol  (ESTRACE ) 0.5 MG tablet 90 tablet 1    Sig: Take 1 tablet (0.5 mg total) by mouth daily.   progesterone  (PROMETRIUM ) 100 MG capsule 90 capsule 1    Sig: Take 1 capsule (100 mg total) by mouth daily. Take with Estradiol  pill.    Return in about 5 months (around 03/15/2025).  Barnie Louder, MD, FACP     [1]  Current Outpatient Medications on File Prior to Visit  Medication Sig Dispense Refill   amitriptyline  (ELAVIL ) 10 MG tablet Take 1 tablet (10 mg total) by mouth at bedtime. 30 tablet 1   cholecalciferol (VITAMIN D3) 25 MCG (1000 UNIT) tablet Take 1,000 Units by mouth daily as needed.     fluconazole  (DIFLUCAN ) 150 MG tablet Take 1 tablet (150 mg total) by mouth daily. 1 tablet 0   vitamin B-12 (CYANOCOBALAMIN) 100 MCG tablet Take 100 mcg by mouth daily.     No current facility-administered medications on file prior to visit.  [2] No Known Allergies  "

## 2024-10-16 ENCOUNTER — Ambulatory Visit: Payer: Self-pay | Admitting: Internal Medicine

## 2024-10-16 LAB — IRON,TIBC AND FERRITIN PANEL
Ferritin: 33 ng/mL (ref 15–150)
Iron Saturation: 32 % (ref 15–55)
Iron: 122 ug/dL (ref 27–159)
Total Iron Binding Capacity: 383 ug/dL (ref 250–450)
UIBC: 261 ug/dL (ref 131–425)

## 2024-10-16 LAB — CBC
Hematocrit: 41.8 % (ref 34.0–46.6)
Hemoglobin: 13.8 g/dL (ref 11.1–15.9)
MCH: 31 pg (ref 26.6–33.0)
MCHC: 33 g/dL (ref 31.5–35.7)
MCV: 94 fL (ref 79–97)
Platelets: 191 x10E3/uL (ref 150–450)
RBC: 4.45 x10E6/uL (ref 3.77–5.28)
RDW: 12.6 % (ref 11.7–15.4)
WBC: 7.3 x10E3/uL (ref 3.4–10.8)

## 2024-10-16 LAB — LIPID PANEL
Chol/HDL Ratio: 3.1 ratio (ref 0.0–4.4)
Cholesterol, Total: 170 mg/dL (ref 100–199)
HDL: 54 mg/dL
LDL Chol Calc (NIH): 94 mg/dL (ref 0–99)
Triglycerides: 122 mg/dL (ref 0–149)
VLDL Cholesterol Cal: 22 mg/dL (ref 5–40)

## 2024-10-27 ENCOUNTER — Other Ambulatory Visit: Payer: Self-pay

## 2024-10-27 ENCOUNTER — Other Ambulatory Visit (HOSPITAL_COMMUNITY): Payer: Self-pay

## 2025-03-18 ENCOUNTER — Ambulatory Visit: Payer: Self-pay | Admitting: Internal Medicine
